# Patient Record
Sex: Female | Born: 1965
Health system: Southern US, Community
[De-identification: ages and names within clinical notes are randomized; demographics above are authoritative.]

## PROBLEM LIST (undated history)

## (undated) DIAGNOSIS — R51 Headache: Secondary | ICD-10-CM

## (undated) DIAGNOSIS — C73 Malignant neoplasm of thyroid gland: Secondary | ICD-10-CM

## (undated) DIAGNOSIS — R569 Unspecified convulsions: Secondary | ICD-10-CM

## (undated) DIAGNOSIS — E0590 Thyrotoxicosis, unspecified without thyrotoxic crisis or storm: Secondary | ICD-10-CM

## (undated) DIAGNOSIS — G709 Myoneural disorder, unspecified: Secondary | ICD-10-CM

## (undated) DIAGNOSIS — J45909 Unspecified asthma, uncomplicated: Secondary | ICD-10-CM

## (undated) DIAGNOSIS — M1990 Unspecified osteoarthritis, unspecified site: Secondary | ICD-10-CM

## (undated) DIAGNOSIS — G259 Extrapyramidal and movement disorder, unspecified: Secondary | ICD-10-CM

## (undated) HISTORY — DX: Unspecified asthma, uncomplicated: J45.909

## (undated) HISTORY — PX: WISDOM TOOTH EXTRACTION: SHX21

## (undated) HISTORY — PX: ABDOMINAL HYSTERECTOMY: SHX81

## (undated) HISTORY — DX: Unspecified osteoarthritis, unspecified site: M19.90

## (undated) HISTORY — PX: CATARACT EXTRACTION: SUR2

---

## 1999-12-14 ENCOUNTER — Other Ambulatory Visit: Admission: RE | Admit: 1999-12-14 | Discharge: 1999-12-14 | Payer: Self-pay | Admitting: Obstetrics and Gynecology

## 2000-11-29 ENCOUNTER — Other Ambulatory Visit: Admission: RE | Admit: 2000-11-29 | Discharge: 2000-11-29 | Payer: Self-pay | Admitting: Obstetrics and Gynecology

## 2001-07-05 ENCOUNTER — Ambulatory Visit (HOSPITAL_COMMUNITY): Admission: RE | Admit: 2001-07-05 | Discharge: 2001-07-05 | Payer: Self-pay | Admitting: General Practice

## 2001-07-05 ENCOUNTER — Encounter: Payer: Self-pay | Admitting: General Practice

## 2001-12-10 ENCOUNTER — Other Ambulatory Visit: Admission: RE | Admit: 2001-12-10 | Discharge: 2001-12-10 | Payer: Self-pay | Admitting: Obstetrics and Gynecology

## 2003-05-28 ENCOUNTER — Other Ambulatory Visit: Admission: RE | Admit: 2003-05-28 | Discharge: 2003-05-28 | Payer: Self-pay | Admitting: Obstetrics and Gynecology

## 2005-03-21 ENCOUNTER — Encounter: Admission: RE | Admit: 2005-03-21 | Discharge: 2005-03-21 | Payer: Self-pay | Admitting: Family Medicine

## 2005-04-10 ENCOUNTER — Encounter: Admission: RE | Admit: 2005-04-10 | Discharge: 2005-04-10 | Payer: Self-pay | Admitting: Family Medicine

## 2007-12-13 ENCOUNTER — Emergency Department (HOSPITAL_COMMUNITY): Admission: EM | Admit: 2007-12-13 | Discharge: 2007-12-13 | Payer: Self-pay | Admitting: Emergency Medicine

## 2011-11-15 ENCOUNTER — Encounter (HOSPITAL_COMMUNITY): Payer: Self-pay | Admitting: Pharmacist

## 2011-11-22 ENCOUNTER — Encounter (INDEPENDENT_AMBULATORY_CARE_PROVIDER_SITE_OTHER): Payer: BC Managed Care – PPO | Admitting: Obstetrics and Gynecology

## 2011-11-22 DIAGNOSIS — N920 Excessive and frequent menstruation with regular cycle: Secondary | ICD-10-CM

## 2011-11-22 DIAGNOSIS — N800 Endometriosis of uterus: Secondary | ICD-10-CM

## 2011-11-22 DIAGNOSIS — N949 Unspecified condition associated with female genital organs and menstrual cycle: Secondary | ICD-10-CM

## 2011-11-22 DIAGNOSIS — N946 Dysmenorrhea, unspecified: Secondary | ICD-10-CM

## 2011-11-23 NOTE — H&P (Signed)
NAMECLEDA, IMEL             ACCOUNT NO.:  [id number]  MEDICAL RECORD NO.:  [id number]  LOCATION:                                 FACILITY:  PHYSICIAN:  Crist Fat. Rivard, M.D. DATE OF BIRTH:  August 17, 1966  DATE OF ADMISSION:  12/05/2011 DATE OF DISCHARGE:                             HISTORY & PHYSICAL   HISTORY OF PRESENT ILLNESS:  Ms. Mance is a 46 year old married white female, para 1-0-0-1, presenting for a robot-assisted hysterectomy because of menorrhagia and severe dysmenorrhea.  For over 10 years, the patient has experienced significant menstrual cramping that was originally relieved by using the Ortho Evra patch, however, the patch caused her to have significant irregular bleeding.  The patient was then placed on oral contraceptives, however, that regimen caused her mouth to become numb.  Consequently, the patient managed her cramps with heating pads and other nonsteroidal pain medications.  Over the last year, however, in addition to increasing intensity in her menstrual cramping, the patient began to experience daily pelvic pressure and cramping that she describes as the contractions of child birth.  She occasionally will have a burning sensation, particularly when she is having her menstrual Periods. She also describes sharp pain that will radiate down her left leg to her foot and cause parts of her foot to become numb and her left vulvar area.  With the patient's monthly menstrual flow, that lasts for 8 days, she will change a pad every 2 hours and endure very large clots that on many occasions, will cause her to soil her clothes.  It is  further noted that during 2 days of her 8 day flow, her cramping becomes so severe that she will actually faint and vomit.  The patient has, on occasion, found some relief from her pain by taking ibuprofen 800 mg, however, over time it caused her to develop mouth sores.  The patient also, on occasion, will experience relief by taking  Vicodin, but nothing will totally eliminate her pain.  She admits to dyspareunia, occasional hot flashes, and constipation, but denies any intermenstrual bleeding, vaginitis symptoms, or urinary tract symptoms.    A pelvic ultrasound in November 2012, showed uterus measuring 9.63 x 7.40 x 6.71 cm, with normal-appearing ovaries bilaterally, though it was noted that the patient's uterus appeared bulky and was tender throughout the exam with other features that questioned the presence of adenomyosis.  The patient also had a TSH performed at the same time that was within normal limits. A review of both medical and surgical management options were given to the patient, however, due to the debilitating and protracted nature of her symptoms, she has decided to proceed with definitive therapy in the form of hysterectomy.  PAST MEDICAL HISTORY:  OB History:  Gravida 1, para 1-0-0-1, the patient had a cesarean section in 1991. GYN History:  Menarche at 46 years old.  Last menstrual period November 18, 2011.  She does not use any method of contraception.  She has no history of abnormal Pap smears or sexually transmitted diseases.  Her last normal Pap smear was November 2012. 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  SURGICAL HISTORY:  Negative.  She has no history of blood transfusions.  FAMILY HISTORY:  Breast cancer (maternal grandmother, age 17), migraines, and hypertension.  HABITS:  The patient does not use tobacco or illicit drugs.  She occasionally will consume alcohol.  SOCIAL HISTORY:  The patient is married and she is employed as an Print production planner in a Therapist, art.  CURRENT MEDICATIONS: 1. Trileptal 300 mg 2 tablets in the morning, 3 tablets at bedtime. 2. Tramadol 50 mg 2 tablets as needed for migraines. 3. Robaxin 750  mg daily as needed. 4. Lorazepam 1 mg daily as needed. 5. Vitex 1 tablet daily. 6. Vitamin E 1000 units daily. 7. Calcium with vitamin D 1 tablet daily. 8. Vitamin C 500 mg 2 tablets once daily. 9. Vitamin B 1000 mcg daily. 10.Swiss-Kriss twice daily.  ALLERGIES:  The patient has sensitivity to ORAL CONTRACEPTIVES, they cause her mouth to become numb.  IBUPROFEN causes her to develop mouth sores.  The patient also is sensitive to various COATINGS ON PILLS.  She denied any sensitivity to peanuts, soy, latex, or shellfish.  REVIEW OF SYSTEMS:  The patient wears glasses.  She has occasional finger arthralgias and ankle arthralgias.  She has problems sleeping, urinary frequency, abdominal bloating, back pain, left leg pain with paresthesias.  Denies, however, any chest pain, shortness of breath, only headache with vision changes when she has migraines.  Denies chronic congestion, difficulty swallowing, dysuria, hematuria, myalgias, and except when she has a flare of her psoriasis, she denies any skin rashes.  PHYSICAL EXAMINATION:  VITAL SIGNS:  Blood pressure is 110/60, pulse is 68, temperature 98.8 degrees Fahrenheit orally, weight 130 pounds, height 5 feet 3 inches tall, body mass index 23. NECK:  Supple without masses.  There is no thyromegaly or cervical adenopathy. HEART:  Regular rate and rhythm. LUNGS:  Clear. BACK:  No CVA tenderness. ABDOMEN:  Diffusely tender without any guarding, rebound, palpable masses, or organomegaly. EXTREMITIES:  No clubbing, cyanosis, or edema. PELVIC:  EG/BUS is normal.  Vagina is normal though there is a large amount of blood in the vaginal vault.  Also, the vaginal walls are tender.  Cervix is without any lesions.  It is very anterior and tender. Uterus appears upper limits of normal size, it is tender.  Adnexa is without any palpable masses, but it is tender bilaterally.  IMPRESSION: 1. Menorrhagia. 2. Severe dysmenorrhea 3. Probable  adenomyosis.  DISPOSITION:  A discussion was held with the patient regarding indications for her procedure along with its risks, which include, but are not limited to, reaction to anesthesia, damage to adjacent organs, infection, excessive bleeding, and the possible need for an open abdominal incision.  The patient was given a MiraLax bowel prep to be completed 24 hours prior to her procedure.  She was further advised that she will experience transient postoperative facial edema, that the robotic approach to her surgery requires more time than that of an open abdominal incision, that her hospital stay is expected to be 0-2 days, and she is expected to be able to return to her usual activities in 2-3 weeks (with the exception of intercourse which will be 6 weeks).  The patient verbalized understanding of these risks as well as instructions for preoperative preparation and has consented to proceed with a robot- assisted hysterectomy at [facility] on December 05, 2011 at 12:15 p.m.     Rashmi Tallent J. Lowell Guitar, P.A.-C  ______________________________ Crist Fat Rivard, M.D.    EJP/MEDQ  D:  11/22/2011  T:  11/23/2011  Job:  [id number]

## 2011-11-27 ENCOUNTER — Encounter (HOSPITAL_COMMUNITY)
Admission: RE | Admit: 2011-11-27 | Discharge: 2011-11-27 | Disposition: A | Discharge status: Home / Self Care | Payer: BC Managed Care – PPO | Source: Ambulatory Visit | Attending: Obstetrics and Gynecology | Admitting: Obstetrics and Gynecology

## 2011-11-27 ENCOUNTER — Encounter (HOSPITAL_COMMUNITY): Payer: Self-pay

## 2011-11-27 HISTORY — DX: Extrapyramidal and movement disorder, unspecified: G25.9

## 2011-11-27 HISTORY — DX: Headache: R51

## 2011-11-27 HISTORY — DX: Unspecified convulsions: R56.9

## 2011-11-27 HISTORY — DX: Myoneural disorder, unspecified: G70.9

## 2011-11-27 LAB — CBC
HCT: 35.1 % — ABNORMAL LOW (ref 36.0–46.0)
MCV: 93.9 fL (ref 78.0–100.0)
RDW: 11.7 % (ref 11.5–15.5)
WBC: 4.1 10*3/uL (ref 4.0–10.5)

## 2011-11-27 NOTE — Anesthesia Preprocedure Evaluation (Addendum)
Anesthesia Evaluation  Patient identified by MRN, date of birth, ID band Patient awake    Reviewed: Allergy & Precautions, H&P , NPO status , Patient's Chart, lab work & pertinent test results, reviewed documented beta blocker date and time   History of Anesthesia Complications Negative for: history of anesthetic complications  Airway Mallampati: II TM Distance: >3 FB Neck ROM: full    Dental No notable dental hx. (+) Teeth Intact and Dental Advisory Given   Pulmonary neg pulmonary ROS, former smoker breath sounds clear to auscultation  Pulmonary exam normal       Cardiovascular Exercise Tolerance: Good negative cardio ROS  Rhythm:regular Rate:Normal     Neuro/Psych  Headaches, Well Controlled,   Neuromuscular disease (dyskinesia R side, jaw periodically) negative neurological ROS  negative psych ROS   GI/Hepatic negative GI ROS, Neg liver ROS,   Endo/Other  negative endocrine ROS  Renal/GU negative Renal ROS     Musculoskeletal   Abdominal   Peds  Hematology negative hematology ROS (+)   Anesthesia Other Findings  Headache   migraines Seizures   as child    Movement disorder   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-takes trileptal to control Neuromuscular disorder   movement disorder    Reproductive/Obstetrics negative OB ROS                          Anesthesia Physical Anesthesia Plan  ASA: II  Anesthesia Plan: General ETT   Post-op Pain Management:    Induction: Intravenous  Airway Management Planned: Oral ETT  Additional Equipment:   Intra-op Plan:   Post-operative Plan: Extubation in OR  Informed Consent: I have reviewed the patients History and Physical, chart, labs and discussed the procedure including the risks, benefits and alternatives for the proposed anesthesia with the patient or authorized representative who has indicated  his/her understanding and acceptance.   Dental Advisory Given  Plan Discussed with: CRNA and Surgeon  Anesthesia Plan Comments: (Discussed risk of increased chorioathetoid movements post operatively.  She is going to continue to take her trileptal.  Benzodiazepines are known to help with acute exacerbations. Patient would like to have antiemetic medications even if they increase the risk worsening her PKD.  Patients questions were answered.  NB: drugs used as an alternative treatment for PKD are risperidone (Karakurum et al. 2003), acetazolamide, levodopa, flunarizine, and tetrabenazine (Jankovic and Demirkiran, 2002).  Plan routine monitors, GETA, careful positioning )      Anesthesia Quick Evaluation

## 2011-11-27 NOTE — Pre-Procedure Instructions (Signed)
Dr Sheral Apley spoke with pt concerning PKC

## 2011-11-27 NOTE — Patient Instructions (Addendum)
   Your procedure is scheduled WU:[id number] March 12th Enter through the Main Entrance of [facility] at:10:45am Pick up the phone at the desk and dial [phone] and inform us of your arrival.  Please call this number if you have any problems the morning of surgery: [phone]  Remember: Do not eat food after midnight: Monday Do not drink clear liquids after:midnight Monday Take these medicines the morning of surgery with a SIP OF WATER: morning medications  Do not wear jewelry, make-up, or FINGER nail polish Do not wear lotions, powders, perfumes or deodorant. Do not shave 48 hours prior to surgery. Do not bring valuables to the hospital. Contacts, dentures or bridgework may not be worn into surgery.  Leave suitcase in the car. After Surgery it may be brought to your room. For patients being admitted to the hospital, checkout time is 11:00am the day of discharge.  Patients discharged on the day of surgery will not be allowed to drive home.     Remember to use your hibiclens as instructed.Please shower with 1/2 bottle the evening before your surgery and the other 1/2 bottle the morning of surgery. Neck down avoiding private area.

## 2011-12-05 ENCOUNTER — Encounter (HOSPITAL_COMMUNITY): Payer: Self-pay | Admitting: *Deleted

## 2011-12-05 ENCOUNTER — Ambulatory Visit (HOSPITAL_COMMUNITY): Payer: BC Managed Care – PPO | Admitting: Anesthesiology

## 2011-12-05 ENCOUNTER — Encounter (HOSPITAL_COMMUNITY): Payer: Self-pay | Admitting: Anesthesiology

## 2011-12-05 ENCOUNTER — Encounter (HOSPITAL_COMMUNITY)
Admission: RE | Disposition: A | Discharge status: Home / Self Care | Payer: Self-pay | Source: Ambulatory Visit | Attending: Obstetrics and Gynecology

## 2011-12-05 ENCOUNTER — Ambulatory Visit (HOSPITAL_COMMUNITY)
Admission: RE | Admit: 2011-12-05 | Discharge: 2011-12-05 | Disposition: A | Discharge status: Home / Self Care | Payer: BC Managed Care – PPO | Source: Ambulatory Visit | Attending: Obstetrics and Gynecology | Admitting: Obstetrics and Gynecology

## 2011-12-05 DIAGNOSIS — N946 Dysmenorrhea, unspecified: Secondary | ICD-10-CM

## 2011-12-05 DIAGNOSIS — N920 Excessive and frequent menstruation with regular cycle: Secondary | ICD-10-CM

## 2011-12-05 DIAGNOSIS — Z01812 Encounter for preprocedural laboratory examination: Secondary | ICD-10-CM | POA: Insufficient documentation

## 2011-12-05 HISTORY — PX: ROBOTIC ASSISTED LAP VAGINAL HYSTERECTOMY: SHX2362

## 2011-12-05 SURGERY — ROBOTIC ASSISTED TOTAL HYSTERECTOMY
Anesthesia: General | Site: Abdomen | Wound class: Clean Contaminated

## 2011-12-05 MED ORDER — PROPOFOL 10 MG/ML IV EMUL
INTRAVENOUS | Status: DC | PRN
  Administered 2011-12-05: 20 mg via INTRAVENOUS
  Administered 2011-12-05: 50 mg via INTRAVENOUS
  Administered 2011-12-05: 180 mg via INTRAVENOUS
  Administered 2011-12-05: 50 mg via INTRAVENOUS

## 2011-12-05 MED ORDER — ONDANSETRON HCL 4 MG/2ML IJ SOLN
INTRAMUSCULAR | Status: AC
  Filled 2011-12-05: qty 2

## 2011-12-05 MED ORDER — MIDAZOLAM HCL 2 MG/2ML IJ SOLN
INTRAMUSCULAR | Status: AC
  Filled 2011-12-05: qty 2

## 2011-12-05 MED ORDER — ONDANSETRON HCL 4 MG/2ML IJ SOLN: 4.0000 mg | Freq: Four times a day (QID) | INTRAMUSCULAR | Status: DC | PRN

## 2011-12-05 MED ORDER — OXYCODONE-ACETAMINOPHEN 5-325 MG PO TABS: 1.0000 | ORAL_TABLET | Freq: Four times a day (QID) | ORAL | Status: AC | PRN

## 2011-12-05 MED ORDER — OXYCODONE-ACETAMINOPHEN 5-325 MG PO TABS
1.0000 | ORAL_TABLET | ORAL | Status: DC | PRN
  Administered 2011-12-05: 2 via ORAL
  Filled 2011-12-05: qty 2

## 2011-12-05 MED ORDER — GLYCOPYRROLATE 0.2 MG/ML IJ SOLN
INTRAMUSCULAR | Status: DC | PRN
  Administered 2011-12-05: 0.4 mg via INTRAVENOUS

## 2011-12-05 MED ORDER — IBUPROFEN 600 MG PO TABS: 600.0000 mg | ORAL_TABLET | Freq: Four times a day (QID) | ORAL | Status: AC

## 2011-12-05 MED ORDER — HYDROMORPHONE HCL PF 1 MG/ML IJ SOLN
INTRAMUSCULAR | Status: AC
  Filled 2011-12-05: qty 1

## 2011-12-05 MED ORDER — DEXTROSE 5 % IV SOLN: 1.0000 g | Freq: Two times a day (BID) | INTRAVENOUS | Status: DC

## 2011-12-05 MED ORDER — BUPIVACAINE HCL (PF) 0.25 % IJ SOLN
INTRAMUSCULAR | Status: AC
  Filled 2011-12-05: qty 30

## 2011-12-05 MED ORDER — ONDANSETRON HCL 4 MG PO TABS: 4.0000 mg | ORAL_TABLET | Freq: Three times a day (TID) | ORAL | Status: AC | PRN

## 2011-12-05 MED ORDER — NEOSTIGMINE METHYLSULFATE 1 MG/ML IJ SOLN
INTRAMUSCULAR | Status: DC | PRN
  Administered 2011-12-05: 3 mg via INTRAVENOUS

## 2011-12-05 MED ORDER — STERILE WATER FOR IRRIGATION IR SOLN
Status: DC | PRN
  Administered 2011-12-05: 1000 mL via INTRAVESICAL

## 2011-12-05 MED ORDER — PROPOFOL 10 MG/ML IV EMUL
INTRAVENOUS | Status: AC
  Filled 2011-12-05: qty 20

## 2011-12-05 MED ORDER — KETOROLAC TROMETHAMINE 30 MG/ML IJ SOLN
INTRAMUSCULAR | Status: DC | PRN
  Administered 2011-12-05: 30 mg via INTRAVENOUS

## 2011-12-05 MED ORDER — HYDROMORPHONE HCL PF 1 MG/ML IJ SOLN
INTRAMUSCULAR | Status: DC | PRN
  Administered 2011-12-05: 1 mg via INTRAVENOUS

## 2011-12-05 MED ORDER — IBUPROFEN 600 MG PO TABS: 600.0000 mg | ORAL_TABLET | Freq: Four times a day (QID) | ORAL | Status: DC

## 2011-12-05 MED ORDER — DEXTROSE 5 % IV SOLN
1.0000 g | Freq: Two times a day (BID) | INTRAVENOUS | Status: DC
  Administered 2011-12-05: 1 g via INTRAVENOUS
  Filled 2011-12-05 (×4): qty 1

## 2011-12-05 MED ORDER — IBUPROFEN 600 MG PO TABS: 600.0000 mg | ORAL_TABLET | Freq: Four times a day (QID) | ORAL | Status: AC | PRN

## 2011-12-05 MED ORDER — MIDAZOLAM HCL 5 MG/5ML IJ SOLN
INTRAMUSCULAR | Status: DC | PRN
  Administered 2011-12-05: 2 mg via INTRAVENOUS

## 2011-12-05 MED ORDER — DEXAMETHASONE SODIUM PHOSPHATE 4 MG/ML IJ SOLN
INTRAMUSCULAR | Status: DC | PRN
  Administered 2011-12-05: 10 mg via INTRAVENOUS

## 2011-12-05 MED ORDER — GLYCOPYRROLATE 0.2 MG/ML IJ SOLN
INTRAMUSCULAR | Status: AC
  Filled 2011-12-05: qty 1

## 2011-12-05 MED ORDER — ROCURONIUM BROMIDE 50 MG/5ML IV SOLN
INTRAVENOUS | Status: AC
  Filled 2011-12-05: qty 1

## 2011-12-05 MED ORDER — LIDOCAINE HCL (CARDIAC) 20 MG/ML IV SOLN
INTRAVENOUS | Status: AC
  Filled 2011-12-05: qty 5

## 2011-12-05 MED ORDER — ONDANSETRON HCL 4 MG/2ML IJ SOLN
INTRAMUSCULAR | Status: DC | PRN
  Administered 2011-12-05: 4 mg via INTRAVENOUS

## 2011-12-05 MED ORDER — LACTATED RINGERS IV SOLN: INTRAVENOUS | Status: DC

## 2011-12-05 MED ORDER — HYDROMORPHONE HCL PF 1 MG/ML IJ SOLN
INTRAMUSCULAR | Status: AC
  Administered 2011-12-05: 0.5 mg via INTRAVENOUS
  Filled 2011-12-05: qty 1

## 2011-12-05 MED ORDER — FENTANYL CITRATE 0.05 MG/ML IJ SOLN
INTRAMUSCULAR | Status: DC | PRN
  Administered 2011-12-05 (×3): 50 ug via INTRAVENOUS
  Administered 2011-12-05: 100 ug via INTRAVENOUS

## 2011-12-05 MED ORDER — BUPIVACAINE HCL (PF) 0.25 % IJ SOLN
INTRAMUSCULAR | Status: DC | PRN
  Administered 2011-12-05: 12 mL

## 2011-12-05 MED ORDER — DEXAMETHASONE SODIUM PHOSPHATE 10 MG/ML IJ SOLN
INTRAMUSCULAR | Status: AC
  Filled 2011-12-05: qty 1

## 2011-12-05 MED ORDER — LACTATED RINGERS IR SOLN
Status: DC | PRN
  Administered 2011-12-05: 3000 mL

## 2011-12-05 MED ORDER — FENTANYL CITRATE 0.05 MG/ML IJ SOLN
INTRAMUSCULAR | Status: AC
  Filled 2011-12-05: qty 5

## 2011-12-05 MED ORDER — HYDROMORPHONE HCL PF 1 MG/ML IJ SOLN
0.2500 mg | INTRAMUSCULAR | Status: DC | PRN
  Administered 2011-12-05 (×2): 0.5 mg via INTRAVENOUS

## 2011-12-05 MED ORDER — OXYCODONE-ACETAMINOPHEN 5-325 MG PO TABS: 1.0000 | ORAL_TABLET | ORAL | Status: AC | PRN

## 2011-12-05 MED ORDER — ROCURONIUM BROMIDE 100 MG/10ML IV SOLN
INTRAVENOUS | Status: DC | PRN
  Administered 2011-12-05: 30 mg via INTRAVENOUS
  Administered 2011-12-05: 40 mg via INTRAVENOUS
  Administered 2011-12-05: 20 mg via INTRAVENOUS
  Administered 2011-12-05: 10 mg via INTRAVENOUS

## 2011-12-05 MED ORDER — LIDOCAINE HCL (CARDIAC) 20 MG/ML IV SOLN
INTRAVENOUS | Status: DC | PRN
  Administered 2011-12-05 (×2): 30 mg via INTRAVENOUS

## 2011-12-05 MED ORDER — LACTATED RINGERS IV SOLN
INTRAVENOUS | Status: DC | PRN
  Administered 2011-12-05 (×3): via INTRAVENOUS

## 2011-12-05 SURGICAL SUPPLY — 71 items
ADH SKN CLS APL DERMABOND .7 (GAUZE/BANDAGES/DRESSINGS) ×1
APL SKNCLS STERI-STRIP NONHPOA (GAUZE/BANDAGES/DRESSINGS)
BAG URINE DRAINAGE (UROLOGICAL SUPPLIES) ×4 IMPLANT
BARRIER ADHS 3X4 INTERCEED (GAUZE/BANDAGES/DRESSINGS) ×2 IMPLANT
BENZOIN TINCTURE PRP APPL 2/3 (GAUZE/BANDAGES/DRESSINGS) ×1 IMPLANT
BRR ADH 4X3 ABS CNTRL BYND (GAUZE/BANDAGES/DRESSINGS) ×1
CABLE HIGH FREQUENCY MONO STRZ (ELECTRODE) ×2 IMPLANT
CATH FOLEY 3WAY  5CC 16FR (CATHETERS) ×2
CATH FOLEY 3WAY  5CC 18FR (CATHETERS)
CATH FOLEY 3WAY 5CC 16FR (CATHETERS) ×2 IMPLANT
CATH FOLEY 3WAY 5CC 18FR (CATHETERS) ×1 IMPLANT
CHLORAPREP W/TINT 26ML (MISCELLANEOUS) ×2 IMPLANT
CLOTH BEACON ORANGE TIMEOUT ST (SAFETY) ×2 IMPLANT
CONT PATH 16OZ SNAP LID 3702 (MISCELLANEOUS) ×2 IMPLANT
CORDS BIPOLAR (ELECTRODE) IMPLANT
COVER MAYO STAND STRL (DRAPES) ×2 IMPLANT
COVER TABLE BACK 60X90 (DRAPES) ×4 IMPLANT
COVER TIP SHEARS 8 DVNC (MISCELLANEOUS) ×1 IMPLANT
COVER TIP SHEARS 8MM DA VINCI (MISCELLANEOUS) ×1
DECANTER SPIKE VIAL GLASS SM (MISCELLANEOUS) ×2 IMPLANT
DERMABOND ADVANCED (GAUZE/BANDAGES/DRESSINGS) ×1
DERMABOND ADVANCED .7 DNX12 (GAUZE/BANDAGES/DRESSINGS) ×1 IMPLANT
DRAPE HUG U DISPOSABLE (DRAPE) ×2 IMPLANT
DRAPE LG THREE QUARTER DISP (DRAPES) ×4 IMPLANT
DRAPE MONITOR DA VINCI (DRAPE) IMPLANT
DRAPE WARM FLUID 44X44 (DRAPE) ×2 IMPLANT
ELECT REM PT RETURN 9FT ADLT (ELECTROSURGICAL) ×2
ELECTRODE REM PT RTRN 9FT ADLT (ELECTROSURGICAL) ×1 IMPLANT
EVACUATOR SMOKE 8.L (FILTER) ×2 IMPLANT
GAUZE VASELINE 3X9 (GAUZE/BANDAGES/DRESSINGS) IMPLANT
GLOVE BIOGEL PI IND STRL 7.0 (GLOVE) ×3 IMPLANT
GLOVE BIOGEL PI INDICATOR 7.0 (GLOVE) ×4
GLOVE ECLIPSE 6.5 STRL STRAW (GLOVE) ×7 IMPLANT
GOWN STRL REIN XL XLG (GOWN DISPOSABLE) ×14 IMPLANT
GRASPER BIPOLAR FEN DA VINCI (INSTRUMENTS)
GRASPER BPLR FEN DVNC (INSTRUMENTS) IMPLANT
KIT ACCESSORY DA VINCI DISP (KITS) ×1
KIT ACCESSORY DVNC DISP (KITS) ×1 IMPLANT
KIT DISP ACCESSORY 4 ARM (KITS) IMPLANT
NS IRRIG 1000ML POUR BTL (IV SOLUTION) ×6 IMPLANT
OCCLUDER COLPOPNEUMO (BALLOONS) ×1 IMPLANT
PACK LAVH (CUSTOM PROCEDURE TRAY) ×2 IMPLANT
PAD PREP 24X48 CUFFED NSTRL (MISCELLANEOUS) ×4 IMPLANT
PLUG CATH AND CAP STER (CATHETERS) ×2 IMPLANT
POSITIONER SURGICAL ARM (MISCELLANEOUS) ×4 IMPLANT
PROTECTOR NERVE ULNAR (MISCELLANEOUS) ×4 IMPLANT
SET CYSTO W/LG BORE CLAMP LF (SET/KITS/TRAYS/PACK) ×1 IMPLANT
SET IRRIG TUBING LAPAROSCOPIC (IRRIGATION / IRRIGATOR) ×2 IMPLANT
SOLUTION ELECTROLUBE (MISCELLANEOUS) ×2 IMPLANT
SPONGE LAP 18X18 X RAY DECT (DISPOSABLE) IMPLANT
STRIP CLOSURE SKIN 1/2X4 (GAUZE/BANDAGES/DRESSINGS) ×2 IMPLANT
SUT MNCRL AB 3-0 PS2 27 (SUTURE) IMPLANT
SUT VIC AB 0 CT1 27 (SUTURE) ×12
SUT VIC AB 0 CT1 27XBRD ANTBC (SUTURE) ×6 IMPLANT
SUT VIC AB 0 CT2 27 (SUTURE) IMPLANT
SUT VIC AB 2-0 CT2 27 (SUTURE) ×2 IMPLANT
SUT VICRYL 0 UR6 27IN ABS (SUTURE) ×4 IMPLANT
SYR 50ML LL SCALE MARK (SYRINGE) ×2 IMPLANT
SYSTEM CONVERTIBLE TROCAR (TROCAR) ×2 IMPLANT
TIP UTERINE 5.1X6CM LAV DISP (MISCELLANEOUS) IMPLANT
TIP UTERINE 6.7X10CM GRN DISP (MISCELLANEOUS) ×1 IMPLANT
TIP UTERINE 6.7X6CM WHT DISP (MISCELLANEOUS) IMPLANT
TIP UTERINE 6.7X8CM BLUE DISP (MISCELLANEOUS) IMPLANT
TOWEL OR 17X24 6PK STRL BLUE (TOWEL DISPOSABLE) ×6 IMPLANT
TROCAR 12M 150ML BLUNT (TROCAR) ×2 IMPLANT
TROCAR DISP BLADELESS 8 DVNC (TROCAR) ×1 IMPLANT
TROCAR DISP BLADELESS 8MM (TROCAR) ×1
TROCAR XCEL 12X100 BLDLESS (ENDOMECHANICALS) ×2 IMPLANT
TROCAR Z-THREAD BLADED 12X100M (TROCAR) IMPLANT
TUBING FILTER THERMOFLATOR (ELECTROSURGICAL) ×2 IMPLANT
WARMER LAPAROSCOPE (MISCELLANEOUS) ×2 IMPLANT

## 2011-12-05 NOTE — Anesthesia Postprocedure Evaluation (Signed)
Anesthesia Post Note  Patient: Rhonda Deleon  Procedure(s) Performed: Procedure(s) (LRB): ROBOTIC ASSISTED TOTAL HYSTERECTOMY (N/A) ROBOTIC ASSISTED LAPAROSCOPIC LYSIS OF ADHESION (N/A)  Anesthesia type: General  Patient location: PACU  Post pain: Pain level controlled  Post assessment: Post-op Vital signs reviewed  Last Vitals:  Filed Vitals:   12/05/11 1515  BP: 123/64  Pulse: 93  Temp: 36.9 C  Resp: 14    Post vital signs: Reviewed  Level of consciousness: sedated  Complications: No apparent anesthesia complicationsfj

## 2011-12-05 NOTE — Discharge Planning (Cosign Needed)
Physician Discharge Summary  Patient ID: Rhonda Deleon MRN: [id number] DOB/AGE: July 21, 1966 46 y.o.  Admit date: 12/05/2011 Discharge date: 12/05/2011  Admission Diagnoses: Menorrhagia, Dysmenorrhea  Discharge Diagnoses: Menorrhagia, Dysmenorrhea        Principal Problem:  *Menorrhagia   Discharged Condition: Good  Hospital Course: On date of admission patient underwent a robot assisted laparoscopic hysterectomy with lysis of adhesions and tolerated procedure well.   Disposition: Discharged Home  Discharge Orders    Future Appointments: Provider: Department: Dept Phone: Center:   12/19/2011 9:40 AM Esmeralda Arthur, MD [facility] [phone] None   01/17/2012 9:50 AM Esmeralda Arthur, MD [facility] [phone] None     Future Orders Please Complete By Expires   Nursing communication      Scheduling Instructions:   Please discontinue IV before discharge     Medication List  As of 12/05/2011  3:19 PM   TAKE these medications         ascorbic acid 500 MG tablet   Commonly known as: VITAMIN C   Take 1,000 mg by mouth daily.      CALCIUM-VITAMIN D PO   Take 1 tablet by mouth daily. Calcium 1200 mg & Vitamin D 1000 IU      cholecalciferol 1000 UNITS tablet   Commonly known as: VITAMIN D   Take 1,000 Units by mouth daily.      FERROGELS FORTE 460-60-0.01-1 MG Caps   Generic drug: Fe Fum-Vit C-Vit B12-FA   Take 1 capsule by mouth 2 (two) times daily.      ibuprofen 600 MG tablet   Commonly known as: ADVIL,MOTRIN   Take 1 tablet (600 mg total) by mouth every 6 (six) hours as needed for pain.      ondansetron 4 MG tablet   Commonly known as: ZOFRAN   Take 1 tablet (4 mg total) by mouth every 8 (eight) hours as needed for nausea.      OVER THE COUNTER MEDICATION   Take 2 tablets by mouth daily. Swiss Kriss Laxative      Oxcarbazepine 300 MG tablet   Commonly known as: TRILEPTAL   Take 600-900 mg by mouth 2 (two) times daily. 600 mg in the morning and 900 mg in the  evening      oxyCODONE-acetaminophen 5-325 MG per tablet   Commonly known as: PERCOCET   Take 1 tablet by mouth every 6 (six) hours as needed for pain.      SUMAtriptan 6 MG/0.5ML Soln injection   Commonly known as: IMITREX   Inject 6 mg into the skin every 2 (two) hours as needed. For migraines      traMADol 50 MG tablet   Commonly known as: ULTRAM   Take 100 mg by mouth daily as needed. For migraines      vitamin B-12 1000 MCG tablet   Commonly known as: CYANOCOBALAMIN   Take 1,000 mcg by mouth daily.      vitamin E 1000 UNIT capsule   Take 1,000 Units by mouth daily.      VITEX EXTRACT PO   Take 1 tablet by mouth daily.           Follow-up Information    Follow up with [facility], MD on 01/17/2012. (Appointment is at 9:50 a.m.)    Contact information:   [address] [place] [address] [phone]          Signed: Patrick Jupiter 12/05/2011, 3:19 PM

## 2011-12-05 NOTE — Brief Op Note (Addendum)
12/05/2011  3:00 PM  PATIENT:  Rhonda Deleon  46 y.o. female  PRE-OPERATIVE DIAGNOSIS:  Menorrhagia, Dysmenorrhea  POST-OPERATIVE DIAGNOSIS:  Menorrhagia, Dysmenorrhea    Procedure(s): ROBOTIC ASSISTED TOTAL HYSTERECTOMY ROBOTIC ASSISTED LAPAROSCOPIC LYSIS OF ADHESION    Surgeon(s): Esmeralda Arthur, MD   ASSISTANTS: Henreitta Leber PA-C   ANESTHESIA:   general  LOCAL MEDICATIONS USED:  MARCAINE     SPECIMEN:  uteru weighing 250.9 g  DISPOSITION OF SPECIMEN:  PATHOLOGY  COUNTS:  YES  ESTIMATED BLOOD LOSS: 30 cc  PATIENT DISPOSITION:  PACU - hemodynamically stable.   Dictation #[id number]  [id number] AMD 12/05/2011 3:00 PM

## 2011-12-05 NOTE — Transfer of Care (Signed)
Immediate Anesthesia Transfer of Care Note  Patient: Rhonda Deleon  Procedure(s) Performed: Procedure(s) (LRB): ROBOTIC ASSISTED TOTAL HYSTERECTOMY (N/A) ROBOTIC ASSISTED LAPAROSCOPIC LYSIS OF ADHESION (N/A)  Patient Location: PACU  Anesthesia Type: General  Level of Consciousness: awake, alert  and oriented  Airway & Oxygen Therapy: Patient Spontanous Breathing and Patient connected to nasal cannula oxygen  Post-op Assessment: Report given to PACU RN and Post -op Vital signs reviewed and stable  Post vital signs: stable  Complications: No apparent anesthesia complications

## 2011-12-05 NOTE — Progress Notes (Signed)
DC IV, Dc instructions reviewed, prescriptions given to patient and significant other. Lap sites WNL, pt has no complaints and no concerns. Tolerated diet, Ambulating and urinated sufficiently. DC to main entrance.

## 2011-12-05 NOTE — Interval H&P Note (Signed)
History and Physical Interval Note:  12/05/2011 7:32 AM  Rhonda Deleon  has presented today for surgery, with the diagnosis of Menorrhagia, Dysmenorrhea  The various methods of treatment have been discussed with the patient and family. After consideration of risks, benefits and other options for treatment, the patient has consented to  Procedure(s) (LRB): ROBOTIC ASSISTED TOTAL HYSTERECTOMY (N/A) as a surgical intervention .  The patients' history has been reviewed, patient examined, no change in status, stable for surgery.  I have reviewed the patients' chart and labs.  Questions were answered to the patient's satisfaction.     Karne Ozga A

## 2011-12-05 NOTE — Discharge Instructions (Signed)
Call [facility] @ [phone] if:  You have a temperature greater than or equal to 100.4 degrees Farenheit orally You have pain that is not made better by the pain medication given and taken as directed You have excessive bleeding or problems urinating You have any other questions or concerns  Take Colace (Docusate Sodium/Stool Softener) 100 mg 2-3 times daily while taking narcotic  pain medicine to avoid constipation or until bowel movements are regular.  You may walk up steps You may shower tomorrow You may resume a regular diet Keep incisions clean and dry Avoid anything in vagina for 6 weeks (or until after your post-operative visit)   Follow up with Dr. Estanislado Pandy on January 17 2012 @ 9:50 am

## 2011-12-06 NOTE — Op Note (Signed)
Rhonda Deleon, Rhonda Deleon             ACCOUNT NO.:  [id number]  MEDICAL RECORD NO.:  [id number]  LOCATION:  9309                          FACILITY:  [facility]  PHYSICIAN:  Crist Fat. Collette Pescador, M.D. DATE OF BIRTH:  1965-12-18  DATE OF PROCEDURE:  12/05/2011 DATE OF DISCHARGE:  12/05/2011                              OPERATIVE REPORT   PREOPERATIVE DIAGNOSIS:  Severe dysmenorrhea with menorrhagia.  POSTOPERATIVE DIAGNOSIS:  Severe dysmenorrhea with menorrhagia with pelvic adhesions.  ANESTHESIA:  General with Dr. Jean Rosenthal.  PROCEDURE:  Robotically assisted total hysterectomy with lysis of adhesions.  SURGEON:  Crist Fat. Mustafa Potts, M.D.  ASSISTANTMarquis Lunch. Lowell Guitar, P.A.  ESTIMATED BLOOD LOSS:  30 mL.  PROCEDURE IN DETAIL:  After being informed of the planned procedure with possible complications including bleeding, infection, injury to other organs, informed consent was obtained.  The patient was taken to OR #7 and given general anesthesia with endotracheal intubation without any complication.  She was placed in a lithotomy position on a sticky mattress and beanbag.  Arms were padded and tucked on each side, with knee-high sequential compressive devices.  She was then prepped and draped in a sterile fashion.  A Foley catheter was inserted into her bladder.  Pelvic exam reveals a retroverted uterus, slightly bulky but mobile and 2 normal adnexa.  A weighted speculum was inserted in the vagina and anterior lip of the cervix was grasped with a tenaculum forceps.  The uterus was sounded at 9.5 cm.  The cervix was easily dilated using Hegar dilator until #27, which allows easy placement of a #8 RUMI intrauterine manipulator.  Its balloon was inflated with 8 mL of saline.  This was positioned with a 3.5, KOH ring.  The KOH ring is sutured to the cervix with 2 simple sutures of 0-Vicryl, and retractors were removed as well as the tenaculum.  We infiltrated just above the umbilicus with 5 mL of  Marcaine 0.25 and performed a vertical 10 mm incision, which was brought down bluntly to the fascia.  The fascia was identified and grasped with 2 Kocher forceps and incised with Mayo scissors.  Peritoneum was entered bluntly.  A pursestring suture of 0-Vicryl was placed on the fascia and a 12-mm Hasson trocar was easily positioned in the abdominal cavity, held in place by the pursestring suture.  This allowed for easy insufflation of a pneumoperitoneum using warmed CO2 at a maximum pressure of 15 mmHg.  The camera was inserted for first evaluation of the pelvis, which revealed a normal anterior cul-de-sac, normal posterior cul-de-sac, a retroverted uterus that appears bulky and soft, which could be compatible with our suspicion of an adenomyosis.  Both tubes and both ovaries are normal, although the left ovary has a small corpus luteum cyst measuring approximately 1.5 cm.  We see adhesions between the cecum and the right pelvic wall, as well as the appendix and the infundibulopelvic ligaments.  Trocar placement was then decided and we proceeded with insertion of one 8-mm robotic trocar on the left, one 8-mm robotic trocar on the right, and one 10-mm patient side assistant trocar on the right, all under direct visualization after infiltration with Marcaine 0.25.  The robot was  docked on the left and a monopolar scissor was inserted in arm #1 and a PK Gyrus forceps was inserted in arm #2.  Preparation and docking is 32 minutes.  We started with the right side with cauterizing the right tube and sectioning, cauterizing the utero-ovarian ligament and sectioning it, and cauterizing the round ligament and sectioning it.  This gives [place] easy access to the front anterior sheath of the broad ligament, which is dissected and incised all the way to the left side overlooking the KOH ring, which was easily identified.  We then proceeded with blunt and sharp dissection of the bladder flap at the  vesicouterine junction, which was performed very easily despite the previous C-section.  The KOH ring was then easily visualized.  We then dissected down the posterior sheath of the broad ligament all the way to the posterior KOH ring keeping the right ureter under direct visualization.  We then proceeded in the same fashion on the left side.  First we cauterized the tube and sectioned it, then we cauterized the utero-ovarian ligament and sectioned, and then we cauterized the left round ligament and sectioned it.  Anterior sheath of the broad ligament was then dissected and incised to meet the first right-sided incision.  The rest of the bladder was dissected easily bluntly and sharply.  The posterior sheath of the broad ligament was then dissected down to the KOH ring skeletonizing the uterine artery bundle in the same fashion.  The bladder was then inflated with 200 mL of saline to note a satisfactory dissection of the bladder flap with no injury to the bladder.  It was then emptied and pressure was applied on the KOH ring to isolate the uterine artery on the left, which was then cauterized with PK forceps and sectioned, and we proceeded in the same way on the right with pressure on the KOH ring.  Ureter under direct visualization, we cauterized the uterine vessels at the level of the KOH ring using PK Gyrus forceps.  The vaginal occluder was then inflated and we proceed with a 360-degree colpotomy using an open monopolar scissor, freeing the uterus entirely.  The uterus was then delivered vaginally with some resistance.  The vaginal occluder was reinserted in the vagina and we cannot proceed with closure of the vaginal cuff after changing our instruments for suture cut in arm #1 and a long tipped forceps in arm #2.  The vaginal cuff was closed with figure-of-eight stitches of 0- Vicryl.  Hemostasis was completed with cauterization on peritoneal edges.  We then proceeded with profuse  irrigation with warm saline to confirm a satisfactory hemostasis on the vaginal cuff.  Some bleeding from the left ovary due to rupture of the corpus luteum cyst was then controlled with cauterization.  Both ureters were visualized to have good peristalsis and no dilatation.  A sheet of Interceed was then deposited on the vaginal cuff and underneath the ovaries.  Please note that while we proceeded on the right side, we also proceeded with sharp dissection of the previously mentioned adhesions freeing the cecum and freeing the appendix.  All instruments were then removed.  The robot was undocked.  Trocars were removed under direct visualization after evacuating the pneumoperitoneum.  The fascia of the supraumbilical incision was closed by the previously placed pursestring suture of 0-Vicryl.  The fascia of the patient side 10-mm trocar was closed with a figure-of-eight stitch of 0-Vicryl.  The skin of all incisions was then closed with subcuticular suture  of 3-0 Monocryl and Dermabond.  Vaginal revision confirms an adequate closure of the vaginal cuff with good hemostasis.  We do note 2 lateral vaginal laceration that are superficial but oozing.  Both were repaired with a running lock suture of 3-0 Vicryl.  We also noted a small vaginal fornix laceration that was also superficial and also closed with a running locked suture of 3-0 Vicryl.  Instrument and sponge count was complete x2.  Estimated blood loss was 30 mL.  The procedure was well tolerated by the patient who was taken to the recovery room in a well and stable condition.  SPECIMEN:  Uterus sent to pathology, weighing 250.9 g.     Dois Davenport A. Madisyn Mawhinney, M.D.     SAR/MEDQ  D:  12/05/2011  T:  12/06/2011  Job:  [id number]

## 2011-12-06 NOTE — Addendum Note (Signed)
Addendum  created 12/06/11 1325 by Shanon Payor, CRNA   Modules edited:Notes Section

## 2011-12-06 NOTE — Anesthesia Postprocedure Evaluation (Signed)
  Anesthesia Post-op Note  Patient: Rhonda Deleon  Procedure(s) Performed: Procedure(s) (LRB): ROBOTIC ASSISTED TOTAL HYSTERECTOMY (N/A) ROBOTIC ASSISTED LAPAROSCOPIC LYSIS OF ADHESION (N/A)  Patient Location: [facility]  Anesthesia Type: General  Level of Consciousness: awake, alert  and oriented  Airway and Oxygen Therapy: Patient Spontanous Breathing  Post-op Pain: none  Post-op Assessment: Post-op Vital signs reviewed and Patient's Cardiovascular Status Stable  Post-op Vital Signs: Reviewed and stable  Complications: No apparent anesthesia complications

## 2011-12-19 ENCOUNTER — Other Ambulatory Visit: Payer: Self-pay | Admitting: Obstetrics and Gynecology

## 2011-12-19 ENCOUNTER — Ambulatory Visit (HOSPITAL_COMMUNITY)
Admission: RE | Admit: 2011-12-19 | Discharge: 2011-12-19 | Disposition: A | Discharge status: Home / Self Care | Payer: BC Managed Care – PPO | Source: Ambulatory Visit | Attending: Obstetrics and Gynecology | Admitting: Obstetrics and Gynecology

## 2011-12-19 ENCOUNTER — Encounter (INDEPENDENT_AMBULATORY_CARE_PROVIDER_SITE_OTHER): Payer: BC Managed Care – PPO | Admitting: Obstetrics and Gynecology

## 2011-12-19 DIAGNOSIS — R0602 Shortness of breath: Secondary | ICD-10-CM

## 2011-12-19 DIAGNOSIS — R079 Chest pain, unspecified: Secondary | ICD-10-CM | POA: Insufficient documentation

## 2011-12-19 DIAGNOSIS — R0789 Other chest pain: Secondary | ICD-10-CM

## 2011-12-19 DIAGNOSIS — N800 Endometriosis of uterus: Secondary | ICD-10-CM

## 2012-01-17 ENCOUNTER — Encounter: Payer: Self-pay | Admitting: Obstetrics and Gynecology

## 2012-01-17 ENCOUNTER — Ambulatory Visit (INDEPENDENT_AMBULATORY_CARE_PROVIDER_SITE_OTHER): Payer: BC Managed Care – PPO | Admitting: Obstetrics and Gynecology

## 2012-01-17 VITALS — BP 136/78 | HR 72 | Wt 123.0 lb

## 2012-01-17 DIAGNOSIS — N800 Endometriosis of uterus: Secondary | ICD-10-CM

## 2012-01-17 NOTE — Progress Notes (Signed)
Surgery: Robotic hysterectomy Date: 12/05/11  Eating a regular diet without difficulty. Bowel movements are normal.  The patient is not having any pain.  Bladder function is returned to normal. Vaginal bleeding: spotting Vaginal discharge: no vaginal discharge    Subjective:     Rhonda Deleon is a 46 y.o. female who presents 6 weeks status post Robotic hysterectomy for adenomyosis.  The following portions of the patient's history were reviewed and updated as appropriate: allergies, current medications, past family history, past medical history, past social history, past surgical history and problem list.  Review of Systems Pertinent items are noted in HPI.   Objective:    BP 136/78  Pulse 72  Wt 123 lb (55.792 kg)  LMP 11/18/2011 Weight:  Wt Readings from Last 1 Encounters:  01/17/12 123 lb (55.792 kg)    BMI: There is no height on file to calculate BMI.  General Appearance: Alert, appropriate appearance for age. No acute distress Incision/s: healing well Pelvic Exam: vaginal cuff healing well   Bimanual exam normal  Assessment:    Doing well postoperatively. Operative findings again reviewed. Pathology report discussed.    Plan:     2. Wound care discussed. 3. Activity restrictions: no sex for 2 more weeks 4. Anticipated return to work: not applicable. 5. Follow up: 1 year for AEX   Jonluke Cobbins A MD 4/24/20139:17 AM

## 2012-05-06 NOTE — Progress Notes (Signed)
Quick Note:  Please send "Dense breast" letter to patient and document in chart when letter is sent. ______ 

## 2012-05-07 ENCOUNTER — Encounter: Payer: Self-pay | Admitting: Obstetrics and Gynecology

## 2012-06-25 ENCOUNTER — Other Ambulatory Visit (HOSPITAL_COMMUNITY): Payer: Self-pay | Admitting: Internal Medicine

## 2012-06-25 DIAGNOSIS — J45909 Unspecified asthma, uncomplicated: Secondary | ICD-10-CM

## 2012-06-27 ENCOUNTER — Ambulatory Visit (HOSPITAL_COMMUNITY)
Admission: RE | Admit: 2012-06-27 | Discharge: 2012-06-27 | Disposition: A | Discharge status: Home / Self Care | Payer: BC Managed Care – PPO | Source: Ambulatory Visit | Attending: Internal Medicine | Admitting: Internal Medicine

## 2012-06-27 ENCOUNTER — Encounter (HOSPITAL_COMMUNITY): Payer: BC Managed Care – PPO

## 2012-06-27 DIAGNOSIS — J45909 Unspecified asthma, uncomplicated: Secondary | ICD-10-CM | POA: Insufficient documentation

## 2012-06-27 MED ORDER — METHACHOLINE 1 MG/ML NEB SOLN
2.0000 mL | Freq: Once | RESPIRATORY_TRACT | Status: DC
  Filled 2012-06-27: qty 2

## 2012-06-27 MED ORDER — SODIUM CHLORIDE 0.9 % IN NEBU
3.0000 mL | INHALATION_SOLUTION | Freq: Once | RESPIRATORY_TRACT | Status: AC
  Administered 2012-06-27: 3 mL via RESPIRATORY_TRACT
  Filled 2012-06-27: qty 3

## 2012-06-27 MED ORDER — METHACHOLINE 0.0625 MG/ML NEB SOLN
2.0000 mL | Freq: Once | RESPIRATORY_TRACT | Status: AC
  Administered 2012-06-27: 0.125 mg via RESPIRATORY_TRACT
  Filled 2012-06-27: qty 2

## 2012-06-27 MED ORDER — METHACHOLINE 4 MG/ML NEB SOLN
2.0000 mL | Freq: Once | RESPIRATORY_TRACT | Status: DC
  Filled 2012-06-27: qty 2

## 2012-06-27 MED ORDER — ALBUTEROL SULFATE (5 MG/ML) 0.5% IN NEBU
2.5000 mg | INHALATION_SOLUTION | Freq: Once | RESPIRATORY_TRACT | Status: AC
  Administered 2012-06-27: 2.5 mg via RESPIRATORY_TRACT

## 2012-06-27 MED ORDER — METHACHOLINE 0.25 MG/ML NEB SOLN
2.0000 mL | Freq: Once | RESPIRATORY_TRACT | Status: DC
  Filled 2012-06-27: qty 2

## 2012-06-27 MED ORDER — METHACHOLINE 16 MG/ML NEB SOLN
2.0000 mL | Freq: Once | RESPIRATORY_TRACT | Status: DC
  Filled 2012-06-27: qty 2

## 2013-01-06 ENCOUNTER — Other Ambulatory Visit: Payer: Self-pay | Admitting: Neurology

## 2013-01-09 ENCOUNTER — Other Ambulatory Visit: Payer: Self-pay | Admitting: Neurology

## 2013-01-09 ENCOUNTER — Other Ambulatory Visit: Payer: Self-pay

## 2013-01-09 MED ORDER — TRAMADOL HCL 50 MG PO TABS: 50.0000 mg | ORAL_TABLET | Freq: Four times a day (QID) | ORAL | Status: DC | PRN

## 2013-01-09 NOTE — Telephone Encounter (Signed)
Refill WID

## 2013-02-12 ENCOUNTER — Other Ambulatory Visit: Payer: Self-pay | Admitting: Neurology

## 2013-02-14 ENCOUNTER — Encounter: Payer: Self-pay | Admitting: Nurse Practitioner

## 2013-02-14 ENCOUNTER — Ambulatory Visit (INDEPENDENT_AMBULATORY_CARE_PROVIDER_SITE_OTHER): Payer: BC Managed Care – PPO | Admitting: Nurse Practitioner

## 2013-02-14 VITALS — BP 113/66 | HR 80 | Ht 64.5 in | Wt 119.0 lb

## 2013-02-14 DIAGNOSIS — G2589 Other specified extrapyramidal and movement disorders: Secondary | ICD-10-CM

## 2013-02-14 DIAGNOSIS — G249 Dystonia, unspecified: Secondary | ICD-10-CM | POA: Insufficient documentation

## 2013-02-14 DIAGNOSIS — G40219 Localization-related (focal) (partial) symptomatic epilepsy and epileptic syndromes with complex partial seizures, intractable, without status epilepticus: Secondary | ICD-10-CM

## 2013-02-14 DIAGNOSIS — G248 Other dystonia: Secondary | ICD-10-CM

## 2013-02-14 DIAGNOSIS — G43909 Migraine, unspecified, not intractable, without status migrainosus: Secondary | ICD-10-CM | POA: Insufficient documentation

## 2013-02-14 NOTE — Patient Instructions (Addendum)
Will switch to Oxtellar XR 600mg  tab (2) daily Continue other meds Call next week for prescription Followup yearly and when necessary

## 2013-02-14 NOTE — Progress Notes (Signed)
HPI: Patient returns for followup after last visit with Dr. Vickey Huger 11/24/2011. She has a nocturnal movement disorder and also migraines. She reports that her migraines have gotten much better she had a hysterectomy in March of 2013. She continues to take oxcarbamazepine for her movement disorder and she feels  like it wears off sometimes. Her labs are followed by her primary care physician. She is no longer taking Imitrex shots. She is no longer taking vitamin B12. She continues to take diclofenac and Flexeril when necessary.  ROS:  headache  Physical Exam General: well developed, well nourished, seated, in no evident distress Head: head normocephalic and atraumatic. Oropharynx benign Neck: supple with no carotid or supraclavicular bruits Cardiovascular: regular rate and rhythm, no murmurs  Neurologic Exam Mental Status: Awake and fully alert. Oriented to place and time. Follows all commands . Cranial Nerves:  Pupils equal, briskly reactive to light. Extraocular movements full without nystagmus. Visual fields full to confrontation. Hearing intact and symmetric to finger snap. Facial sensation intact. Face, tongue, palate move normally and symmetrically. Neck flexion and extension normal.  Motor: Normal bulk and tone. Normal strength in all tested extremity muscles. No focal weakness Sensory.: intact to touch and pinprick and vibratory.  Coordination: Rapid alternating movements normal in all extremities. Finger-to-nose and heel-to-shin performed accurately bilaterally. No dysmetria or ataxia Gait and Station: Arises from chair without difficulty. Stance is normal. Gait demonstrates normal stride length and balance . Able to heel, toe and tandem walk without difficulty.  Reflexes: 1+ and symmetric. Toes downgoing.     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

## 2013-02-18 ENCOUNTER — Telehealth: Payer: Self-pay | Admitting: Neurology

## 2013-02-18 MED ORDER — OXCARBAZEPINE ER 600 MG PO TB24: 1200.0000 mg | ORAL_TABLET | Freq: Every day | ORAL | Status: DC

## 2013-02-18 NOTE — Telephone Encounter (Signed)
Refill done and called in

## 2013-02-18 NOTE — Telephone Encounter (Signed)
Patient calling and would like for Oxtellar XR 600 mg to be called in. The patient states that it worked.

## 2013-02-19 NOTE — Telephone Encounter (Signed)
Called patient and left her a message that her meds were called in.

## 2013-05-09 ENCOUNTER — Other Ambulatory Visit: Payer: Self-pay | Admitting: Neurology

## 2013-07-29 ENCOUNTER — Other Ambulatory Visit: Payer: Self-pay | Admitting: Radiology

## 2013-08-07 ENCOUNTER — Ambulatory Visit (INDEPENDENT_AMBULATORY_CARE_PROVIDER_SITE_OTHER): Payer: BC Managed Care – PPO | Admitting: General Surgery

## 2013-08-07 ENCOUNTER — Encounter (INDEPENDENT_AMBULATORY_CARE_PROVIDER_SITE_OTHER): Payer: Self-pay | Admitting: General Surgery

## 2013-08-07 VITALS — BP 124/84 | HR 76 | Temp 98.6°F | Resp 15 | Ht 60.5 in | Wt 118.4 lb

## 2013-08-07 DIAGNOSIS — D249 Benign neoplasm of unspecified breast: Secondary | ICD-10-CM | POA: Insufficient documentation

## 2013-08-07 DIAGNOSIS — D242 Benign neoplasm of left breast: Secondary | ICD-10-CM

## 2013-08-07 NOTE — Progress Notes (Signed)
Patient ID: Rhonda Deleon, female   DOB: 1965/12/12, 47 y.o.   MRN: [id number]  Chief Complaint  Patient presents with  . New Evaluation    eval left br papilloma`    HPI Rhonda Deleon is a 47 y.o. female. She is referred by Dr. Andrey Deleon at [facility] for consideration of excision of a papilloma, left breast, peripheral location, 9:00 position. Her PCP is Dr. Quitman Deleon.  This patient is a reasonably healthy and has no prior history of breast problems. She felt a lump in her left breast medially recently. Mammogram and ultrasounds were performed. The only abnormal finding was an oval-shaped mass in the left breast at the 9:00 position 7 mm by ultrasound. She also had a small cyst in the upper outer quadrant of the left breast. At the time of image guided biopsy they said that this was somewhat spiculated. Pathology report from the biopsy shows intraductal papilloma. No atypia was mentioned. She was referred for excision.  History reveals breast cancer in a maternal grandmother but no other breast or ovarian or prostate cancer.  Comorbidities include psoriatic arthritis on Remicade. Last Remicade infusion October 11. Migraine headaches. Asthma. HPI  Past Medical History  Diagnosis Date  . Headache(784.0)     migraines  . Seizures     as child  . 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 Neuromuscular disorder     movement disorder  . Arthritis   . Asthma     Past Surgical History  Procedure Laterality Date  . Cesarean section  1991  . Wisdom tooth extraction  as child  . Robotic assisted lap vaginal hysterectomy  12/05/11    Family History  Problem Relation Age of Onset  . Hypertension Mother   . Cancer Maternal Grandmother     breast    Social History History  Substance Use Topics  . Smoking status: Former Smoker    Quit date: 09/25/1997  . Smokeless tobacco: Never  Used  . Alcohol Use: 1.0 oz/week    2 drink(s) per week     Comment: social    No Known Allergies  Current Outpatient Prescriptions  Medication Sig Dispense Refill  . Betamethasone Valerate 0.12 % foam       . chlorpheniramine-HYDROcodone (TUSSIONEX) 10-8 MG/5ML LQCR       . Diclofenac Potassium (ZIPSOR) 25 MG CAPS Take 25 mg by mouth 2 (two) times daily.      Marland Kitchen FLOVENT HFA 110 MCG/ACT inhaler       . fluticasone (FLONASE) 50 MCG/ACT nasal spray       . inFLIXimab (REMICADE) 100 MG injection Inject into the vein.      . methocarbamol (ROBAXIN) 750 MG tablet TAKE ONE TABLET BY MOUTH ONCE A DAY  30 tablet  3  . polyethylene glycol (MIRALAX / GLYCOLAX) packet Take 17 g by mouth daily.      . traMADol (ULTRAM) 50 MG tablet Take 1 tablet (50 mg total) by mouth every 6 (six) hours as needed for pain.  30 tablet  0  . VENTOLIN HFA 108 (90 BASE) MCG/ACT inhaler        No current facility-administered medications for this visit.    Review of Systems Review of Systems  Constitutional: Negative for fever, chills and unexpected weight change.  HENT: Negative for congestion, hearing loss, sore throat, trouble swallowing and voice change.   Eyes: Negative for visual  disturbance.  Respiratory: Negative for cough and wheezing.   Cardiovascular: Negative for chest pain, palpitations and leg swelling.  Gastrointestinal: Negative for nausea, vomiting, abdominal pain, diarrhea, constipation, blood in stool, abdominal distention and anal bleeding.  Genitourinary: Negative for hematuria, vaginal bleeding and difficulty urinating.  Musculoskeletal: Positive for arthralgias and myalgias.  Skin: Negative for rash and wound.  Neurological: Positive for headaches. Negative for seizures and syncope.  Hematological: Negative for adenopathy. Does not bruise/bleed easily.  Psychiatric/Behavioral: Negative for confusion.    Blood pressure 124/84, pulse 76, temperature 98.6 F (37 C), temperature source  Temporal, resp. rate 15, height 5' 0.5" (1.537 m), weight 118 lb 6.4 oz (53.706 kg), last menstrual period 11/18/2011.  Physical Exam Physical Exam  Constitutional: She is oriented to person, place, and time. She appears well-developed and well-nourished. No distress.  HENT:  Head: Normocephalic and atraumatic.  Nose: Nose normal.  Mouth/Throat: No oropharyngeal exudate.  Eyes: Conjunctivae and EOM are normal. Pupils are equal, round, and reactive to light. Left eye exhibits no discharge. No scleral icterus.  Neck: Neck supple. No JVD present. No tracheal deviation present. No thyromegaly present.  Cardiovascular: Normal rate, regular rhythm, normal heart sounds and intact distal pulses.   No murmur heard. Pulmonary/Chest: Effort normal and breath sounds normal. No respiratory distress. She has no wheezes. She has no rales. She exhibits no tenderness.    Small palpable mass in the left breast at the 8:30 position. This is very medial in one of the thinner areas of the breast. It is slightly irregular. No significant bruising. No hematoma.  Abdominal: Soft. Bowel sounds are normal. She exhibits no distension and no mass. There is no tenderness. There is no rebound and no guarding.  Musculoskeletal: She exhibits no edema and no tenderness.  Lymphadenopathy:    She has no cervical adenopathy.  Neurological: She is alert and oriented to person, place, and time. She exhibits normal muscle tone. Coordination normal.  Skin: Skin is warm. No rash noted. She is not diaphoretic. No erythema. No pallor.  Psychiatric: She has a normal mood and affect. Her behavior is normal. Judgment and thought content normal.    Data Reviewed Imaging studies and pathology report  Assessment    Intraductal papilloma left breast, peripheral, 8:30 position. Excision is indicated to exclude occult noninvasive cancer  Psoriatic arthritis on Remicade infusions periodically.  Migraine headaches  Asthma      Plan    Scheduled for a left lumpectomy with needle localization and margin assessment in the near future  I have discussed the indications, details, techniques, and numerous risks of the surgery with the patient and her husband. They're aware of the risk of bleeding, infection, cosmetic deformity, nerve damage with chronic pain, reoperation if malignant with positive margins, and other unforeseen problems. She understands all these issues well. All of her questions are answered. She agrees with this plan.        Angelia Mould. Derrell Lolling, M.D., [facility] General and Minimally invasive Surgery Breast and Colorectal Surgery Office:   [phone] Pager:   [phone]  08/07/2013, 10:17 AM

## 2013-08-07 NOTE — Patient Instructions (Signed)
The palpable mass in your left breast at the 9:00 position was biopsied and shows a benign intraductal papilloma. There is a small possibility that there could be early, noninvasive breast cancer in the area.This area should be completely excised for complete pathologic examination.  You will be scheduled for a left lumpectomy with needle localization in the near future.     Lumpectomy A lumpectomy is a form of "breast conserving" or "breast preservation" surgery. It may also be referred to as a partial mastectomy. During a lumpectomy, the portion of the breast that contains the cancerous tumor or breast mass (the lump) is removed. Some normal tissue around the lump may also be removed to make sure all the tumor has been removed. This surgery should take 40 minutes or less. LET [facility] CARE PROVIDER KNOW ABOUT:  Any allergies you have.  All medicines you are taking, including vitamins, herbs, eye drops, creams, and over-the-counter medicines.  Previous problems you or members of your family have had with the use of anesthetics.  Any blood disorders you have.  Previous surgeries you have had.  Medical conditions you have. RISKS AND COMPLICATIONS Generally, this is a safe procedure. However, as with any procedure, complications can occur. Possible complications include:  Bleeding.  Infection.  Pain.  Temporary swelling.  Change in the shape of the breast, particularly if a large portion is removed. BEFORE THE PROCEDURE  Ask your health care provider about changing or stopping your regular medicines.  Do not eat or drink anything for 7 8 hours before the surgery or as directed by your health care provider. Ask your health care provider if you can take a sip of water with any approved medicines.  On the day of surgery, your healthcare provider will use a mammogram or ultrasound to locate and mark the tumor in your breast. These markings on your breast will show where the cut  (incision) will be made. PROCEDURE   An IV tube will be put into one of your veins.  You may be given medicine to help you relax before the surgery (sedative). You will be given one of the following:  A medicine that numbs the area (local anesthesia).  A medicine that makes you go to sleep (general anesthesia).  Your health care provider will use a kind of electric scalpel that uses heat to minimize bleeding (electrocautery knife).  A curved incision (like a smile or frown) that follows the natural curve of your breast is made, to allow for minimal scarring and better healing.  The tumor will be removed with some of the surrounding tissue. This will be sent to the lab for analysis. Your health care provider may also remove your lymph nodes at this time if needed.  Sometimes, but not always, a rubber tube called a drain will be surgically inserted into your breast area or armpit to collect excess fluid that may accumulate in the space where the tumor was. This drain is connected to a plastic bulb on the outside of your body. This drain creates suction to help remove the fluid.  The incisions will be closed with stitches (sutures).  A bandage may be placed over the incisions. AFTER THE PROCEDURE  You will be taken to the recovery area.  You will be given medicine for pain.  A small rubber drain may be placed in the breast for 2 3 days to prevent a collection of blood (hematoma) from developing in the breast. You will be given instructions on  caring for the drain before you go home.  A pressure bandage (dressing) will be applied for 1 2 days to prevent bleeding. Ask your health care provider how to care for your bandage at home. Document Released: 10/23/2006 Document Revised: 05/14/2013 Document Reviewed: 02/14/2013 [facility] Patient Information 2014 [place], [place].

## 2013-08-11 ENCOUNTER — Encounter (INDEPENDENT_AMBULATORY_CARE_PROVIDER_SITE_OTHER): Payer: Self-pay

## 2013-08-11 ENCOUNTER — Encounter (HOSPITAL_BASED_OUTPATIENT_CLINIC_OR_DEPARTMENT_OTHER): Payer: Self-pay | Admitting: *Deleted

## 2013-08-11 NOTE — Progress Notes (Signed)
No labs needed

## 2013-08-12 NOTE — H&P (Signed)
Rhonda Deleon   MRN:  [id number]   Description: 47 year old female  Provider: Ernestene Mention, MD  Department: [facility]        Diagnoses    Papilloma of breast, left    -  Primary    217      Reason for Visit    New Evaluation    eval left br papilloma`        Current Vitals - Last Recorded    BP Pulse Temp(Src) Resp Ht Wt    124/84 76 98.6 F (37 C) (Temporal) 15 5' 0.5" (1.537 m) 118 lb 6.4 oz (53.706 kg)       BMI 22.73 kg/m2 11/18/2011                   History and Physical   Ernestene Mention, MD    Status: Signed            Patient ID: Rhonda Deleon, female   DOB: 1965/12/27, 47 y.o.   MRN: [id number]              HPI Rhonda Deleon is a 47 y.o. female. She is referred by Dr. Rogelia Mire at [facility] for consideration of excision of a papilloma, left breast, peripheral location, 9:00 position. Her PCP is Dr. Quitman Livings.   This patient is a reasonably healthy and has no prior history of breast problems. She felt a lump in her left breast medially recently. Mammogram and ultrasounds were performed. The only abnormal finding was an oval-shaped mass in the left breast at the 9:00 position 7 mm by ultrasound. She also had a small cyst in the upper outer quadrant of the left breast. At the time of image guided biopsy they said that this was somewhat spiculated. Pathology report from the biopsy shows intraductal papilloma. No atypia was mentioned. She was referred for excision.   History reveals breast cancer in a maternal grandmother but no other breast or ovarian or prostate cancer.   Comorbidities include psoriatic arthritis on Remicade. Last Remicade infusion October 11. Migraine headaches. Asthma.       Past Medical History   Diagnosis  Date   .  Headache(784.0)         migraines   .  Seizures         as child   .  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  Neuromuscular disorder         movement disorder   .  Arthritis     .  Asthma          Past Surgical History   Procedure  Laterality  Date   .  Cesarean section    1991   .  Wisdom tooth extraction    as child   .  Robotic assisted lap vaginal hysterectomy    12/05/11        Family History   Problem  Relation  Age of Onset   .  Hypertension  Mother     .  Cancer  Maternal Grandmother         breast       Social History History   Substance Use Topics   .  Smoking status:  Former Smoker       Quit date:  09/25/1997   .  Smokeless tobacco:  Never  Used   .  Alcohol Use:  1.0 oz/week       2 drink(s) per week         Comment: social        No Known Allergies    Current Outpatient Prescriptions   Medication  Sig  Dispense  Refill   .  Betamethasone Valerate 0.12 % foam           .  chlorpheniramine-HYDROcodone (TUSSIONEX) 10-8 MG/5ML LQCR           .  Diclofenac Potassium (ZIPSOR) 25 MG CAPS  Take 25 mg by mouth 2 (two) times daily.         Marland Kitchen  FLOVENT HFA 110 MCG/ACT inhaler           .  fluticasone (FLONASE) 50 MCG/ACT nasal spray           .  inFLIXimab (REMICADE) 100 MG injection  Inject into the vein.         .  methocarbamol (ROBAXIN) 750 MG tablet  TAKE ONE TABLET BY MOUTH ONCE A DAY   30 tablet   3   .  polyethylene glycol (MIRALAX / GLYCOLAX) packet  Take 17 g by mouth daily.         .  traMADol (ULTRAM) 50 MG tablet  Take 1 tablet (50 mg total) by mouth every 6 (six) hours as needed for pain.   30 tablet   0   .  VENTOLIN HFA 108 (90 BASE) MCG/ACT inhaler                 Review of Systems  Constitutional: Negative for fever, chills and unexpected weight change.  HENT: Negative for congestion, hearing loss, sore throat, trouble swallowing and voice change.   Eyes: Negative for visual disturbance.  Respiratory: Negative for cough and wheezing.   Cardiovascular: Negative for chest pain, palpitations and leg swelling.   Gastrointestinal: Negative for nausea, vomiting, abdominal pain, diarrhea, constipation, blood in stool, abdominal distention and anal bleeding.  Genitourinary: Negative for hematuria, vaginal bleeding and difficulty urinating.  Musculoskeletal: Positive for arthralgias and myalgias.  Skin: Negative for rash and wound.  Neurological: Positive for headaches. Negative for seizures and syncope.  Hematological: Negative for adenopathy. Does not bruise/bleed easily.  Psychiatric/Behavioral: Negative for confusion.      Blood pressure 124/84, pulse 76, temperature 98.6 F (37 C), temperature source Temporal, resp. rate 15, height 5' 0.5" (1.537 m), weight 118 lb 6.4 oz (53.706 kg), last menstrual period 11/18/2011.   Physical Exam  Constitutional: She is oriented to person, place, and time. She appears well-developed and well-nourished. No distress.  HENT:   Head: Normocephalic and atraumatic.   Nose: Nose normal.   Mouth/Throat: No oropharyngeal exudate.  Eyes: Conjunctivae and EOM are normal. Pupils are equal, round, and reactive to light. Left eye exhibits no discharge. No scleral icterus.  Neck: Neck supple. No JVD present. No tracheal deviation present. No thyromegaly present.  Cardiovascular: Normal rate, regular rhythm, normal heart sounds and intact distal pulses.    No murmur heard. Pulmonary/Chest: Effort normal and breath sounds normal. No respiratory distress. She has no wheezes. She has no rales. She exhibits no tenderness.    Small palpable mass in the left breast at the 8:30 position. This is very medial in one of the thinner areas of the breast. It is slightly irregular. No significant bruising. No hematoma.  Abdominal: Soft. Bowel sounds are normal. She exhibits no distension and  no mass. There is no tenderness. There is no rebound and no guarding.  Musculoskeletal: She exhibits no edema and no tenderness.  Lymphadenopathy:    She has no cervical adenopathy.   Neurological: She is alert and oriented to person, place, and time. She exhibits normal muscle tone. Coordination normal.  Skin: Skin is warm. No rash noted. She is not diaphoretic. No erythema. No pallor.  Psychiatric: She has a normal mood and affect. Her behavior is normal. Judgment and thought content normal.      Data Reviewed Imaging studies and pathology report   Assessment    Intraductal papilloma left breast, peripheral, 8:30 position. Excision is indicated to exclude occult noninvasive cancer   Psoriatic arthritis on Remicade infusions periodically.   Migraine headaches   Asthma      Plan    Scheduled for a left lumpectomy with needle localization and margin assessment in the near future   I have discussed the indications, details, techniques, and numerous risks of the surgery with the patient and her husband. They're aware of the risk of bleeding, infection, cosmetic deformity, nerve damage with chronic pain, reoperation if malignant with positive margins, and other unforeseen problems. She understands all these issues well. All of her questions are answered. She agrees with this plan.           Angelia Mould. Derrell Lolling, M.D., [facility] General and Minimally invasive Surgery Breast and Colorectal Surgery Office:   [phone] Pager:   [phone]

## 2013-08-13 ENCOUNTER — Encounter (INDEPENDENT_AMBULATORY_CARE_PROVIDER_SITE_OTHER): Payer: Self-pay

## 2013-08-15 ENCOUNTER — Ambulatory Visit (HOSPITAL_BASED_OUTPATIENT_CLINIC_OR_DEPARTMENT_OTHER): Payer: BC Managed Care – PPO | Admitting: Anesthesiology

## 2013-08-15 ENCOUNTER — Encounter (HOSPITAL_BASED_OUTPATIENT_CLINIC_OR_DEPARTMENT_OTHER): Payer: Self-pay | Admitting: *Deleted

## 2013-08-15 ENCOUNTER — Encounter (HOSPITAL_BASED_OUTPATIENT_CLINIC_OR_DEPARTMENT_OTHER): Payer: BC Managed Care – PPO | Admitting: Anesthesiology

## 2013-08-15 ENCOUNTER — Encounter (HOSPITAL_BASED_OUTPATIENT_CLINIC_OR_DEPARTMENT_OTHER)
Admission: RE | Disposition: A | Discharge status: Home / Self Care | Payer: Self-pay | Source: Ambulatory Visit | Attending: General Surgery

## 2013-08-15 ENCOUNTER — Ambulatory Visit (HOSPITAL_BASED_OUTPATIENT_CLINIC_OR_DEPARTMENT_OTHER)
Admission: RE | Admit: 2013-08-15 | Discharge: 2013-08-15 | Disposition: A | Discharge status: Home / Self Care | Payer: BC Managed Care – PPO | Source: Ambulatory Visit | Attending: General Surgery | Admitting: General Surgery

## 2013-08-15 DIAGNOSIS — R569 Unspecified convulsions: Secondary | ICD-10-CM | POA: Insufficient documentation

## 2013-08-15 DIAGNOSIS — D249 Benign neoplasm of unspecified breast: Secondary | ICD-10-CM | POA: Insufficient documentation

## 2013-08-15 DIAGNOSIS — J45909 Unspecified asthma, uncomplicated: Secondary | ICD-10-CM | POA: Insufficient documentation

## 2013-08-15 DIAGNOSIS — D242 Benign neoplasm of left breast: Secondary | ICD-10-CM

## 2013-08-15 DIAGNOSIS — L4050 Arthropathic psoriasis, unspecified: Secondary | ICD-10-CM | POA: Insufficient documentation

## 2013-08-15 DIAGNOSIS — G255 Other chorea: Secondary | ICD-10-CM | POA: Insufficient documentation

## 2013-08-15 DIAGNOSIS — N6019 Diffuse cystic mastopathy of unspecified breast: Secondary | ICD-10-CM

## 2013-08-15 DIAGNOSIS — Z87891 Personal history of nicotine dependence: Secondary | ICD-10-CM | POA: Insufficient documentation

## 2013-08-15 DIAGNOSIS — N6089 Other benign mammary dysplasias of unspecified breast: Secondary | ICD-10-CM | POA: Insufficient documentation

## 2013-08-15 DIAGNOSIS — G709 Myoneural disorder, unspecified: Secondary | ICD-10-CM | POA: Insufficient documentation

## 2013-08-15 DIAGNOSIS — Z803 Family history of malignant neoplasm of breast: Secondary | ICD-10-CM | POA: Insufficient documentation

## 2013-08-15 DIAGNOSIS — G43909 Migraine, unspecified, not intractable, without status migrainosus: Secondary | ICD-10-CM | POA: Insufficient documentation

## 2013-08-15 HISTORY — PX: BREAST LUMPECTOMY WITH NEEDLE LOCALIZATION: SHX5759

## 2013-08-15 LAB — POCT HEMOGLOBIN-HEMACUE: Hemoglobin: 14.1 g/dL (ref 12.0–15.0)

## 2013-08-15 SURGERY — BREAST LUMPECTOMY WITH NEEDLE LOCALIZATION
Anesthesia: General | Site: Breast | Laterality: Left | Wound class: Clean

## 2013-08-15 MED ORDER — LACTATED RINGERS IV SOLN
INTRAVENOUS | Status: DC
  Administered 2013-08-15 (×2): via INTRAVENOUS

## 2013-08-15 MED ORDER — FENTANYL CITRATE 0.05 MG/ML IJ SOLN
25.0000 ug | INTRAMUSCULAR | Status: DC | PRN
  Administered 2013-08-15: 25 ug via INTRAVENOUS

## 2013-08-15 MED ORDER — FENTANYL CITRATE 0.05 MG/ML IJ SOLN
INTRAMUSCULAR | Status: AC
  Filled 2013-08-15: qty 6

## 2013-08-15 MED ORDER — CEFAZOLIN SODIUM-DEXTROSE 2-3 GM-% IV SOLR
2.0000 g | INTRAVENOUS | Status: AC
  Administered 2013-08-15: 2 g via INTRAVENOUS

## 2013-08-15 MED ORDER — PROMETHAZINE HCL 25 MG/ML IJ SOLN: 6.2500 mg | INTRAMUSCULAR | Status: DC | PRN

## 2013-08-15 MED ORDER — LIDOCAINE HCL (CARDIAC) 20 MG/ML IV SOLN
INTRAVENOUS | Status: DC | PRN
  Administered 2013-08-15: 100 mg via INTRAVENOUS

## 2013-08-15 MED ORDER — HYDROCODONE-ACETAMINOPHEN 5-325 MG PO TABS: 1.0000 | ORAL_TABLET | ORAL | Status: DC | PRN

## 2013-08-15 MED ORDER — BUPIVACAINE-EPINEPHRINE PF 0.5-1:200000 % IJ SOLN
INTRAMUSCULAR | Status: AC
  Filled 2013-08-15: qty 30

## 2013-08-15 MED ORDER — CHLORHEXIDINE GLUCONATE 4 % EX LIQD: 1.0000 "application " | Freq: Once | CUTANEOUS | Status: DC

## 2013-08-15 MED ORDER — PROPOFOL 10 MG/ML IV EMUL
INTRAVENOUS | Status: AC
  Filled 2013-08-15: qty 50

## 2013-08-15 MED ORDER — FENTANYL CITRATE 0.05 MG/ML IJ SOLN
INTRAMUSCULAR | Status: DC | PRN
  Administered 2013-08-15: 100 ug via INTRAVENOUS
  Administered 2013-08-15 (×2): 50 ug via INTRAVENOUS

## 2013-08-15 MED ORDER — MIDAZOLAM HCL 2 MG/2ML IJ SOLN: 0.5000 mg | Freq: Once | INTRAMUSCULAR | Status: DC | PRN

## 2013-08-15 MED ORDER — OXYCODONE HCL 5 MG PO TABS: 5.0000 mg | ORAL_TABLET | Freq: Once | ORAL | Status: DC | PRN

## 2013-08-15 MED ORDER — FENTANYL CITRATE 0.05 MG/ML IJ SOLN
INTRAMUSCULAR | Status: AC
  Filled 2013-08-15: qty 2

## 2013-08-15 MED ORDER — MIDAZOLAM HCL 2 MG/2ML IJ SOLN: 0.5000 mg | INTRAMUSCULAR | Status: DC | PRN

## 2013-08-15 MED ORDER — MIDAZOLAM HCL 2 MG/2ML IJ SOLN
INTRAMUSCULAR | Status: AC
  Filled 2013-08-15: qty 2

## 2013-08-15 MED ORDER — CEFAZOLIN SODIUM-DEXTROSE 2-3 GM-% IV SOLR
INTRAVENOUS | Status: AC
  Filled 2013-08-15: qty 50

## 2013-08-15 MED ORDER — BUPIVACAINE-EPINEPHRINE 0.5% -1:200000 IJ SOLN
INTRAMUSCULAR | Status: DC | PRN
  Administered 2013-08-15: 10 mL

## 2013-08-15 MED ORDER — OXYCODONE HCL 5 MG/5ML PO SOLN: 5.0000 mg | Freq: Once | ORAL | Status: DC | PRN

## 2013-08-15 MED ORDER — PROPOFOL 10 MG/ML IV BOLUS
INTRAVENOUS | Status: DC | PRN
  Administered 2013-08-15: 200 mg via INTRAVENOUS

## 2013-08-15 MED ORDER — LACTATED RINGERS IV SOLN
INTRAVENOUS | Status: DC
  Administered 2013-08-15: 11:00:00 via INTRAVENOUS

## 2013-08-15 MED ORDER — MEPERIDINE HCL 25 MG/ML IJ SOLN: 6.2500 mg | INTRAMUSCULAR | Status: DC | PRN

## 2013-08-15 MED ORDER — DEXAMETHASONE SODIUM PHOSPHATE 4 MG/ML IJ SOLN
INTRAMUSCULAR | Status: DC | PRN
  Administered 2013-08-15: 10 mg via INTRAVENOUS

## 2013-08-15 MED ORDER — MIDAZOLAM HCL 5 MG/5ML IJ SOLN
INTRAMUSCULAR | Status: DC | PRN
  Administered 2013-08-15: 2 mg via INTRAVENOUS

## 2013-08-15 MED ORDER — FENTANYL CITRATE 0.05 MG/ML IJ SOLN: 50.0000 ug | INTRAMUSCULAR | Status: DC | PRN

## 2013-08-15 SURGICAL SUPPLY — 61 items
ADH SKN CLS APL DERMABOND .7 (GAUZE/BANDAGES/DRESSINGS)
APL SKNCLS STERI-STRIP NONHPOA (GAUZE/BANDAGES/DRESSINGS)
APPLIER CLIP 11 MED OPEN (CLIP) ×2
APR CLP MED 11 20 MLT OPN (CLIP) ×1
BANDAGE ELASTIC 6 VELCRO ST LF (GAUZE/BANDAGES/DRESSINGS) IMPLANT
BENZOIN TINCTURE PRP APPL 2/3 (GAUZE/BANDAGES/DRESSINGS) IMPLANT
BLADE HEX COATED 2.75 (ELECTRODE) ×2 IMPLANT
BLADE SURG 10 STRL SS (BLADE) IMPLANT
BLADE SURG 15 STRL LF DISP TIS (BLADE) ×2 IMPLANT
BLADE SURG 15 STRL SS (BLADE) ×4
CANISTER SUCT 1200ML W/VALVE (MISCELLANEOUS) ×2 IMPLANT
CHLORAPREP W/TINT 26ML (MISCELLANEOUS) ×2 IMPLANT
CLIP APPLIE 11 MED OPEN (CLIP) ×1 IMPLANT
COVER MAYO STAND STRL (DRAPES) ×2 IMPLANT
COVER TABLE BACK 60X90 (DRAPES) ×2 IMPLANT
DECANTER SPIKE VIAL GLASS SM (MISCELLANEOUS) IMPLANT
DERMABOND ADVANCED (GAUZE/BANDAGES/DRESSINGS)
DERMABOND ADVANCED .7 DNX12 (GAUZE/BANDAGES/DRESSINGS) IMPLANT
DEVICE DUBIN W/COMP PLATE 8390 (MISCELLANEOUS) ×2 IMPLANT
DRAIN CHANNEL 19F RND (DRAIN) IMPLANT
DRAIN HEMOVAC 1/8 X 5 (WOUND CARE) IMPLANT
DRAPE LAPAROSCOPIC ABDOMINAL (DRAPES) ×2 IMPLANT
DRAPE UTILITY XL STRL (DRAPES) ×2 IMPLANT
DRSG PAD ABDOMINAL 8X10 ST (GAUZE/BANDAGES/DRESSINGS) IMPLANT
ELECT REM PT RETURN 9FT ADLT (ELECTROSURGICAL) ×2
ELECTRODE REM PT RTRN 9FT ADLT (ELECTROSURGICAL) ×1 IMPLANT
EVACUATOR SILICONE 100CC (DRAIN) IMPLANT
GAUZE SPONGE 4X4 12PLY STRL LF (GAUZE/BANDAGES/DRESSINGS) IMPLANT
GAUZE SPONGE 4X4 16PLY XRAY LF (GAUZE/BANDAGES/DRESSINGS) IMPLANT
GLOVE BIO SURGEON STRL SZ 6.5 (GLOVE) ×1 IMPLANT
GLOVE BIOGEL PI IND STRL 7.0 (GLOVE) IMPLANT
GLOVE BIOGEL PI INDICATOR 7.0 (GLOVE) ×1
GLOVE EUDERMIC 7 POWDERFREE (GLOVE) ×2 IMPLANT
GOWN PREVENTION PLUS XLARGE (GOWN DISPOSABLE) ×2 IMPLANT
GOWN PREVENTION PLUS XXLARGE (GOWN DISPOSABLE) ×2 IMPLANT
KIT MARKER MARGIN INK (KITS) ×2 IMPLANT
NDL HYPO 25X1 1.5 SAFETY (NEEDLE) ×1 IMPLANT
NEEDLE HYPO 25X1 1.5 SAFETY (NEEDLE) ×2 IMPLANT
NS IRRIG 1000ML POUR BTL (IV SOLUTION) ×2 IMPLANT
PACK BASIN DAY SURGERY FS (CUSTOM PROCEDURE TRAY) ×2 IMPLANT
PAD ALCOHOL SWAB (MISCELLANEOUS) ×1 IMPLANT
PENCIL BUTTON HOLSTER BLD 10FT (ELECTRODE) ×2 IMPLANT
PIN SAFETY STERILE (MISCELLANEOUS) IMPLANT
SLEEVE SCD COMPRESS KNEE MED (MISCELLANEOUS) ×1 IMPLANT
SPONGE GAUZE 4X4 12PLY (GAUZE/BANDAGES/DRESSINGS) ×2 IMPLANT
SPONGE LAP 18X18 X RAY DECT (DISPOSABLE) IMPLANT
SPONGE LAP 4X18 X RAY DECT (DISPOSABLE) ×2 IMPLANT
STRIP CLOSURE SKIN 1/2X4 (GAUZE/BANDAGES/DRESSINGS) IMPLANT
SUT ETHILON 3 0 FSL (SUTURE) IMPLANT
SUT MNCRL AB 4-0 PS2 18 (SUTURE) ×3 IMPLANT
SUT SILK 2 0 SH (SUTURE) ×2 IMPLANT
SUT VIC AB 2-0 CT1 27 (SUTURE)
SUT VIC AB 2-0 CT1 TAPERPNT 27 (SUTURE) IMPLANT
SUT VIC AB 3-0 SH 27 (SUTURE)
SUT VIC AB 3-0 SH 27X BRD (SUTURE) IMPLANT
SUT VICRYL 3-0 CR8 SH (SUTURE) ×2 IMPLANT
SYR CONTROL 10ML LL (SYRINGE) ×2 IMPLANT
TOWEL OR 17X24 6PK STRL BLUE (TOWEL DISPOSABLE) ×4 IMPLANT
TOWEL OR NON WOVEN STRL DISP B (DISPOSABLE) ×2 IMPLANT
TUBE CONNECTING 20X1/4 (TUBING) ×2 IMPLANT
YANKAUER SUCT BULB TIP NO VENT (SUCTIONS) ×2 IMPLANT

## 2013-08-15 NOTE — Anesthesia Procedure Notes (Signed)
Procedure Name: LMA Insertion Date/Time: 08/15/2013 10:44 AM Performed by: Zenia Resides D Pre-anesthesia Checklist: Patient identified, Emergency Drugs available, Suction available and Patient being monitored Patient Re-evaluated:Patient Re-evaluated prior to inductionOxygen Delivery Method: Circle System Utilized Preoxygenation: Pre-oxygenation with 100% oxygen Intubation Type: IV induction Ventilation: Mask ventilation without difficulty LMA: LMA inserted LMA Size: 4.0 Number of attempts: 1 Airway Equipment and Method: bite block Placement Confirmation: positive ETCO2 Tube secured with: Tape Dental Injury: Teeth and Oropharynx as per pre-operative assessment

## 2013-08-15 NOTE — Anesthesia Preprocedure Evaluation (Addendum)
Anesthesia Evaluation  Patient identified by MRN, date of birth, ID band Patient awake    Reviewed: Allergy & Precautions, H&P , NPO status , Patient's Chart, lab work & pertinent test results  History of Anesthesia Complications Negative for: history of anesthetic complications  Airway Mallampati: I TM Distance: >3 FB Neck ROM: Full    Dental  (+) Teeth Intact and Dental Advisory Given   Pulmonary asthma (last inhaler use a week ago) , former smoker (quit 1999),  breath sounds clear to auscultation  Pulmonary exam normal       Cardiovascular negative cardio ROS  Rhythm:Regular Rate:Normal     Neuro/Psych  Headaches, Seizures - (no seizure since childhood), Well Controlled,   Neuromuscular disease (extrapyramidal movement disorder)    GI/Hepatic negative GI ROS, Neg liver ROS,   Endo/Other  negative endocrine ROS  Renal/GU negative Renal ROS     Musculoskeletal   Abdominal   Peds  Hematology negative hematology ROS (+)   Anesthesia Other Findings   Reproductive/Obstetrics TAH                          Anesthesia Physical Anesthesia Plan  ASA: II  Anesthesia Plan: General   Post-op Pain Management:    Induction: Intravenous  Airway Management Planned: LMA  Additional Equipment:   Intra-op Plan:   Post-operative Plan:   Informed Consent: I have reviewed the patients History and Physical, chart, labs and discussed the procedure including the risks, benefits and alternatives for the proposed anesthesia with the patient or authorized representative who has indicated his/her understanding and acceptance.   Dental advisory given  Plan Discussed with: Surgeon and CRNA  Anesthesia Plan Comments: (Plan routine monitors, GA- LMA OK)        Anesthesia Quick Evaluation

## 2013-08-15 NOTE — Anesthesia Postprocedure Evaluation (Signed)
  Anesthesia Post-op Note  Patient: Rhonda Deleon  Procedure(s) Performed: Procedure(s): BREAST LUMPECTOMY WITH NEEDLE LOCALIZATION (Left)  Patient Location: PACU  Anesthesia Type:General  Level of Consciousness: awake, alert , oriented and patient cooperative  Airway and Oxygen Therapy: Patient Spontanous Breathing  Post-op Pain: none  Post-op Assessment: Post-op Vital signs reviewed, Patient's Cardiovascular Status Stable, Respiratory Function Stable, Patent Airway, No signs of Nausea or vomiting and Pain level controlled  Post-op Vital Signs: Reviewed and stable  Complications: No apparent anesthesia complications

## 2013-08-15 NOTE — Op Note (Signed)
Patient Name:           Marquerite Forsman   Date of Surgery:        08/15/2013   Pre op Diagnosis:   Intraductal papilloma left breast, peripheral, 8:30 position. Excision is indicated to exclude occult noninvasive cance        Post op Diagnosis:    Same   Procedure:                 Left partial mastectomy with needle localization and margin assessment  Surgeon:                     Angelia Mould. Derrell Lolling, M.D., FACS  Assistant:                      None  Operative Indications:   Sweet Jarvis is a 47 y.o. female. She is referred by Dr. Rogelia Mire at [facility] health for consideration of excision of a papilloma, left breast, peripheral location, 9:00 position. Her PCP is Dr. Quitman Livings.  This patient is a reasonably healthy, has psoriatic arthritis,and has no prior history of breast problems. She felt a lump in her left breast medially recently. Mammogram and ultrasounds were performed. The only abnormal finding was an oval-shaped mass in the left breast at the 9:00 position 7 mm by ultrasound. She also had a small cyst in the upper outer quadrant of the left breast. At the time of image guided biopsy they said that this was somewhat spiculated. Pathology report from the biopsy shows intraductal papilloma. No atypia was mentioned. She was referred for excision.  History reveals breast cancer in a maternal grandmother but no other breast or ovarian or prostate cancer.   Operative Findings:       The lumpectomy specimen contained the clip and the wire. The clip was in a very superficial location. Dr. Yolanda Bonine  stated that she thought that the wire was going through the mass.  Procedure in Detail:          Wire localization was performed at [employer]. The wire entered the skin below the inframammary crease medially and was directed superiorly. There was an "X" marking the skin overlying the mass. The patient was brought to the operating room. Gen. Anesthesia with LMA device was  induced. The left breast was prepped and draped in a sterile fashion. Surgical time out was performed. 0.5% Marcaine with epinephrine was used as a local infiltration anesthetic. A transverse incision was made in the left breast at about the 8:00 position, over the marked spot. Because the breast is tiny and the marker clip was very superficial I immediately raised skin flaps in the subdermal location and then dissected around the wire and the marker clip down to the pectoralis fascia. I could see the marker clip in a very superficial location, but there was no more breast tissue to excise around this. The specimen was marked with silk sutures to orient the pathologist. The specimen mammogram demonstrated the marker clip and the wire in the specimen and this was discussed with Dr. Yolanda Bonine, who felt that the wire went through the papilloma. The specimen was marked and sent to the lab. Hemostasis was excellent and achieved with electrocautery. The breast tissue was closed with interrupted sutures of 3-0 Vicryl and the skin closed with a running subcuticular suture of 4-0 Monocryl and Dermabond. The patient tolerated the procedure well. She was taken to the recovery. EBL  10 cc or less. Counts correct. Complications none.     Angelia Mould. Derrell Lolling, M.D., FACS General and Minimally Invasive Surgery Breast and Colorectal Surgery  08/15/2013 11:27 AM

## 2013-08-15 NOTE — Interval H&P Note (Signed)
History and Physical Interval Note:  08/15/2013 10:17 AM  Rhonda Deleon  has presented today for surgery, with the diagnosis of lt breast papilloma  The goals and the various methods of treatment have been discussed with the patient and family. After consideration of risks, benefits and other options for treatment, the patient has consented to  Procedure(s): BREAST LUMPECTOMY WITH NEEDLE LOCALIZATION (Left) as a surgical intervention .  The patient's history has been reviewed, patient examined today, no change in status, stable for surgery.  I have reviewed the patient's chart and labs.  Questions were answered to the patient's satisfaction.     Ernestene Mention

## 2013-08-15 NOTE — Transfer of Care (Signed)
Immediate Anesthesia Transfer of Care Note  Patient: Rhonda Deleon  Procedure(s) Performed: Procedure(s): BREAST LUMPECTOMY WITH NEEDLE LOCALIZATION (Left)  Patient Location: PACU  Anesthesia Type:General  Level of Consciousness: awake, alert  and oriented  Airway & Oxygen Therapy: Patient Spontanous Breathing and Patient connected to face mask oxygen  Post-op Assessment: Report given to PACU RN and Post -op Vital signs reviewed and stable  Post vital signs: Reviewed and stable  Complications: No apparent anesthesia complications

## 2013-08-18 ENCOUNTER — Encounter (HOSPITAL_BASED_OUTPATIENT_CLINIC_OR_DEPARTMENT_OTHER): Payer: Self-pay | Admitting: General Surgery

## 2013-08-18 NOTE — Progress Notes (Signed)
Quick Note:  Inform patient of Pathology report,.No malignancy. Benign intraductal papilloma. Good news.  hmi ______

## 2013-08-19 ENCOUNTER — Telehealth (INDEPENDENT_AMBULATORY_CARE_PROVIDER_SITE_OTHER): Payer: Self-pay

## 2013-08-19 NOTE — Telephone Encounter (Signed)
Patient called back and given below message.  Patient states understanding.

## 2013-08-19 NOTE — Telephone Encounter (Signed)
Left message for pt to call and get pathology results below.

## 2013-08-19 NOTE — Telephone Encounter (Signed)
Message copied by Ivory Broad on Tue Aug 19, 2013  9:10 AM ------      Message from: Ernestene Mention      Created: Mon Aug 18, 2013  5:35 PM       Inform patient of Pathology report,.No malignancy. Benign intraductal papilloma. Good news.            hmi ------

## 2013-09-09 ENCOUNTER — Encounter (INDEPENDENT_AMBULATORY_CARE_PROVIDER_SITE_OTHER): Payer: Self-pay | Admitting: General Surgery

## 2013-09-09 ENCOUNTER — Ambulatory Visit (INDEPENDENT_AMBULATORY_CARE_PROVIDER_SITE_OTHER): Payer: BC Managed Care – PPO | Admitting: General Surgery

## 2013-09-09 VITALS — BP 112/82 | HR 64 | Temp 97.4°F | Resp 16 | Ht 63.0 in | Wt 119.4 lb

## 2013-09-09 DIAGNOSIS — D249 Benign neoplasm of unspecified breast: Secondary | ICD-10-CM

## 2013-09-09 DIAGNOSIS — D242 Benign neoplasm of left breast: Secondary | ICD-10-CM

## 2013-09-09 NOTE — Progress Notes (Signed)
Patient ID: Rhonda Deleon, female   DOB: 08/29/1966, 47 y.o.   MRN: [id number] History: Postop visit. Underwent left partial mastectomy with needle localization on 08/15/2013. Final pathology report shows 2 intraductal papillomas. No atypia. No malignancy. She has no complaints about wound healing.  Exam: Patient looks well. Good spirits. Left breast shows transverse incision lower inner quadrant healing very nicely. No sign of any infection or hematoma or fluid.  Assessment: Intraductal papilloma left breast, recovering uneventfully following excision Family history breast cancer in a grandmother  Plan: Routine mammograms from here on out Routine breast exam by her PCP   return to see me as needed.    Angelia Mould. Derrell Lolling, M.D., [facility] General and Minimally invasive Surgery Breast and Colorectal Surgery Office:   [phone] Pager:   [phone]

## 2013-09-09 NOTE — Patient Instructions (Signed)
Your  left breast lumpectomy specimen showed intraductal papillomas. There were 2 side by side to each other. They were completely benign. There was no atypia and no malignancy. We have reviewed the pathology report today.  Your incision is healing nicely. There is no sign of any complication.  Be sure to continue to get routine mammograms and routine breast exam with your primary care physician.  Return to see Dr. Derrell Lolling as needed.

## 2013-09-10 ENCOUNTER — Encounter (INDEPENDENT_AMBULATORY_CARE_PROVIDER_SITE_OTHER): Payer: Self-pay

## 2013-11-19 ENCOUNTER — Other Ambulatory Visit: Payer: Self-pay | Admitting: Radiology

## 2013-11-25 ENCOUNTER — Ambulatory Visit (INDEPENDENT_AMBULATORY_CARE_PROVIDER_SITE_OTHER): Payer: BC Managed Care – PPO | Admitting: General Surgery

## 2013-11-25 ENCOUNTER — Encounter (INDEPENDENT_AMBULATORY_CARE_PROVIDER_SITE_OTHER): Payer: Self-pay | Admitting: General Surgery

## 2013-11-25 VITALS — BP 136/78 | HR 72 | Temp 98.6°F | Resp 12 | Ht 63.0 in | Wt 119.4 lb

## 2013-11-25 DIAGNOSIS — D241 Benign neoplasm of right breast: Secondary | ICD-10-CM | POA: Insufficient documentation

## 2013-11-25 DIAGNOSIS — D249 Benign neoplasm of unspecified breast: Secondary | ICD-10-CM

## 2013-11-25 NOTE — Patient Instructions (Signed)
You'll be scheduled for right partial mastectomy with radioactive seed localization. We will notify [facility] to assist [place] with this.  Our goals will be to remove the area laterally where the papillary lesion is and if there are any hard lumps immediately adjacent to that then we will remove those also.      Lumpectomy A lumpectomy is a form of "breast conserving" or "breast preservation" surgery. It may also be referred to as a partial mastectomy. During a lumpectomy, the portion of the breast that contains the cancerous tumor or breast mass (the lump) is removed. Some normal tissue around the lump may also be removed to make sure all the tumor has been removed. This surgery should take 40 minutes or less. LET [facility] CARE PROVIDER KNOW ABOUT:  Any allergies you have.  All medicines you are taking, including vitamins, herbs, eye drops, creams, and over-the-counter medicines.  Previous problems you or members of your family have had with the use of anesthetics.  Any blood disorders you have.  Previous surgeries you have had.  Medical conditions you have. RISKS AND COMPLICATIONS Generally, this is a safe procedure. However, as with any procedure, complications can occur. Possible complications include:  Bleeding.  Infection.  Pain.  Temporary swelling.  Change in the shape of the breast, particularly if a large portion is removed. BEFORE THE PROCEDURE  Ask your health care provider about changing or stopping your regular medicines.  Do not eat or drink anything for 7 8 hours before the surgery or as directed by your health care provider. Ask your health care provider if you can take a sip of water with any approved medicines.  On the day of surgery, your healthcare provider will use a mammogram or ultrasound to locate and mark the tumor in your breast. These markings on your breast will show where the cut (incision) will be made. PROCEDURE   An IV tube will be  put into one of your veins.  You may be given medicine to help you relax before the surgery (sedative). You will be given one of the following:  A medicine that numbs the area (local anesthesia).  A medicine that makes you go to sleep (general anesthesia).  Your health care provider will use a kind of electric scalpel that uses heat to minimize bleeding (electrocautery knife).  A curved incision (like a smile or frown) that follows the natural curve of your breast is made, to allow for minimal scarring and better healing.  The tumor will be removed with some of the surrounding tissue. This will be sent to the lab for analysis. Your health care provider may also remove your lymph nodes at this time if needed.  Sometimes, but not always, a rubber tube called a drain will be surgically inserted into your breast area or armpit to collect excess fluid that may accumulate in the space where the tumor was. This drain is connected to a plastic bulb on the outside of your body. This drain creates suction to help remove the fluid.  The incisions will be closed with stitches (sutures).  A bandage may be placed over the incisions. AFTER THE PROCEDURE  You will be taken to the recovery area.  You will be given medicine for pain.  A small rubber drain may be placed in the breast for 2 3 days to prevent a collection of blood (hematoma) from developing in the breast. You will be given instructions on caring for the drain before you  go home.  A pressure bandage (dressing) will be applied for 1 2 days to prevent bleeding. Ask your health care provider how to care for your bandage at home. Document Released: 10/23/2006 Document Revised: 05/14/2013 Document Reviewed: 02/14/2013 [facility] Patient Information 2014 [employer].

## 2013-11-25 NOTE — Progress Notes (Signed)
Patient ID: Rhonda Deleon, female   DOB: 1966-07-02, 48 y.o.   MRN: [id number]  No chief complaint on file.   HPI Rhonda Deleon is a 48 y.o. female.  This patient was referred back to me by Dr. Christene Slates at [facility]  for evaluation of a papillary lesion of the right breast.  I originally evaluated this patient on 08/07/2013. At that point in time she was evaluated for her left breast. Imaging studies showed a mass at the 9:00 position on the left. Image guided biopsy showed intraductal papilloma. She did have a family history of breast cancer a maternal grandmother. She underwent left partial mastectomy with needle localization on 08/15/2013. Final pathology showed 2 benign intraductal papillomas and she healed well.  She now thinks that she felt lumps in her right breast. She had imaging studies and then  3 separate areas were biopsied in the right breast laterally. Biopsy at 9:00 showed fibrocystic changes with adenosis. Biopsy at right breast 8:00 showed fibrocystic changes with microcalcifications. No atypia was found.    Additionally, In the right breast, reportedly at 9:00 position a third biopsy was performed and this shows apocrinecells and papillary fragments. Papillary lesion could not be ruled out. The patient states Dr.Cornella  told her that she needed this area excised and is here to discuss this with me. She has not had any skin changes. Denies nipple discharge. Denies increase in pain.  Imaging studies and biopsies are as follows. Ultrasound-guided biopsy of a 1.4 cm solid mass in the right breast at the 8:00 position was performed with findings as described above. Second biopsy is ultrasound-guided biopsy of a 1.2 cm partially solid mass in the right breast at 9:00 middle depth with findings as described above. A third biopsy was aspiration of a 7 mm cyst in the right breast, anterior depth pathology indicated benign papillary fragments. This is the area that we are going to  excise.  Comorbidities include psoriatic arthritis on Remicade. Migraine headaches. Asthma.    HPI  Past Medical History  Diagnosis Date  . Headache(784.0)     migraines  . Seizures     as child  . 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 Neuromuscular disorder     movement disorder  . Asthma   . Arthritis     psoriatic    Past Surgical History  Procedure Laterality Date  . Cesarean section  1991  . Wisdom tooth extraction  as child  . Robotic assisted lap vaginal hysterectomy  12/05/11  . Abdominal hysterectomy      3/13-vag-LOA  . Breast lumpectomy with needle localization Left 08/15/2013    Procedure: BREAST LUMPECTOMY WITH NEEDLE LOCALIZATION;  Surgeon: Adin Hector, MD;  Location: [place];  Service: General;  Laterality: Left;    Family History  Problem Relation Age of Onset  . Hypertension Mother   . Cancer Maternal Grandmother     breast    Social History History  Substance Use Topics  . Smoking status: Former Smoker    Quit date: 09/25/1997  . Smokeless tobacco: Never Used  . Alcohol Use: 1.0 oz/week    2 drink(s) per week     Comment: social    No Known Allergies  Current Outpatient Prescriptions  Medication Sig Dispense Refill  . Betamethasone Valerate 0.12 % foam       . FLOVENT HFA 110 MCG/ACT inhaler       .  fluticasone (FLONASE) 50 MCG/ACT nasal spray       . polyethylene glycol (MIRALAX / GLYCOLAX) packet Take 17 g by mouth daily.      . traMADol (ULTRAM) 50 MG tablet Take 1 tablet (50 mg total) by mouth every 6 (six) hours as needed for pain.  30 tablet  0  . VENTOLIN HFA 108 (90 BASE) MCG/ACT inhaler       . chlorpheniramine-HYDROcodone (TUSSIONEX) 10-8 MG/5ML LQCR       . HYDROcodone-acetaminophen (NORCO/VICODIN) 5-325 MG per tablet Take 1-2 tablets by mouth every 4 (four) hours as needed.  30 tablet  0  . inFLIXimab (REMICADE)  100 MG injection Inject into the vein.      . methocarbamol (ROBAXIN) 750 MG tablet TAKE ONE TABLET BY MOUTH ONCE A DAY  30 tablet  3   No current facility-administered medications for this visit.    Review of Systems Review of Systems  Musculoskeletal: Positive for arthralgias and myalgias.  Neurological: Positive for headaches.    Blood pressure 136/78, pulse 72, temperature 98.6 F (37 C), temperature source Oral, resp. rate 12, height 5\' 3"  (1.6 m), weight 119 lb 6.4 oz (54.159 kg), last menstrual period 11/18/2011.  Physical Exam Physical Exam  Constitutional: She is oriented to person, place, and time. She appears well-developed and well-nourished. No distress.  HENT:  Head: Normocephalic and atraumatic.  Nose: Nose normal.  Mouth/Throat: No oropharyngeal exudate.  Eyes: Conjunctivae and EOM are normal. Pupils are equal, round, and reactive to light. Left eye exhibits no discharge. No scleral icterus.  Neck: Neck supple. No JVD present. No tracheal deviation present. No thyromegaly present.  Cardiovascular: Normal rate, regular rhythm, normal heart sounds and intact distal pulses.   No murmur heard. Pulmonary/Chest: Effort normal and breath sounds normal. No respiratory distress. She has no wheezes. She has no rales. She exhibits no tenderness.    Breasts are medium size. They are very lumpy diffusely. No dominant mass. Recent biopsy sites lateral right breast looked okay with no bleeding or hematoma. Scar left breast well healed. No axillary adenopathy.  Abdominal: Soft. Bowel sounds are normal. She exhibits no distension and no mass. There is no tenderness. There is no rebound and no guarding.  Musculoskeletal: She exhibits no edema and no tenderness.  Lymphadenopathy:    She has no cervical adenopathy.  Neurological: She is alert and oriented to person, place, and time. She exhibits normal muscle tone. Coordination normal.  Skin: Skin is warm. No rash noted. She is not  diaphoretic. No erythema. No pallor.  Psychiatric: She has a normal mood and affect. Her behavior is normal. Judgment and thought content normal.    Data Reviewed Recent imaging studies, biopsy reports, and pathology reports. Mild records  Assessment    Possible papillary lesion right breast, 9 oclock position. Excisional biopsy indicated. Suspect this will be benign  Significant fibrocystic disease with lumpy breasts, difficult exam  Status post left partial mastectomy with needle localization 08/15/2013 revealing 2 benign intraductal papillomas. Uneventful healing   Family history breast cancer in maternal grandmother  Psoriatic arthritis on Remicade infusions periodically  Asthma  Migraine headaches    Plan    She very much would like to the area of the papillary lesion to be excised, and so we're going to schedule her for a right partial mastectomy with radioactive seed localization.  She also asked if I would remove all the painful lumps laterally, and I told her that would probably cause more  problems that it was worth. If there is a very hard lump that is immediately adjacent to the radioactive seeds and I told her I would remove that but that the excision will be conservative. She accepts this. She worries about whether this is going to be an ongoing and repetitive problem. We talked about the pros and cons of MRI and the trouble getting that reimbursed by insurance companies. Were not limited for that at this time.   I discussed the indications, details, techniques, and numerous risk partial mastectomy with her. She is familiar with this from her previous biopsy. She has has a risk of bleeding, infection, reoperation for complication trochanter, cosmetic deformity, and other unforeseen problems. She understands all these issues all her questions were answered. She is in full agreement with this plan.        Edsel Petrin. Dalbert Batman, M.D., [facility] General and Minimally invasive Surgery Breast and Colorectal Surgery Office:   [phone] Pager:   [phone]  11/25/2013, 2:05 PM

## 2013-11-26 ENCOUNTER — Encounter (HOSPITAL_BASED_OUTPATIENT_CLINIC_OR_DEPARTMENT_OTHER): Payer: Self-pay | Admitting: *Deleted

## 2013-11-26 NOTE — Progress Notes (Signed)
Pt for rt lumpectomy-had lt lump 11/14 here

## 2013-11-27 NOTE — H&P (Signed)
Rhonda Deleon   MRN:  [id number]   Description: 48 year old female  Provider: Adin Hector, MD  Department: [facility]                Diagnoses      Papilloma of right breast    -  Primary      217               Current Vitals - Last Recorded      BP Pulse Temp(Src) Resp Ht Wt      136/78 72 98.6 F (37 C) (Oral) 12 5\' 3"  (1.6 m) 119 lb 6.4 oz (54.159 kg)            BMI 21.16 kg/m2 11/18/2011                           History and Physical    Adin Hector, MD    Status: Signed            Patient ID: Rhonda Deleon, female   DOB: 1966/03/07, 48 y.o.   MRN: [id number]           HPI [name] is a 48 y.o. female.  This patient was referred back to me by Dr. Christene Slates at [facility]  for evaluation of a papillary lesion of the right breast.   I originally evaluated this patient on 08/07/2013. At that point in time she was evaluated for her left breast. Imaging studies showed a mass at the 9:00 position on the left. Image guided biopsy showed intraductal papilloma. She did have a family history of breast cancer a maternal grandmother. She underwent left partial mastectomy with needle localization on 08/15/2013. Final pathology showed 2 benign intraductal papillomas and she healed well.   She now thinks that she felt lumps in her right breast. She had imaging studies and then  3 separate areas were biopsied in the right breast laterally. Biopsy at 9:00 showed fibrocystic changes with adenosis. Biopsy at right breast 8:00 showed fibrocystic changes with microcalcifications. No atypia was found.    Additionally, In the right breast, reportedly at 9:00 position a third biopsy was performed and this shows apocrinecells and papillary fragments. Papillary lesion could not be ruled out. The patient states Dr.Cornella  told her that she needed this area excised and is here to discuss this with me. She has not had any skin changes. Denies nipple discharge.  Denies increase in pain.   Imaging studies and biopsies are as follows. Ultrasound-guided biopsy of a 1.4 cm solid mass in the right breast at the 8:00 position was performed with findings as described above. Second biopsy is ultrasound-guided biopsy of a 1.2 cm partially solid mass in the right breast at 9:00 middle depth with findings as described above. A third biopsy was aspiration of a 7 mm cyst in the right breast, anterior depth pathology indicated benign papillary fragments. This is the area that we are going to excise.   Comorbidities include psoriatic arthritis on Remicade. Migraine headaches. Asthma.         Past Medical History   Diagnosis  Date   .  Headache(784.0)         migraines   .  Seizures         as child   .  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  Neuromuscular disorder         movement disorder   .  Asthma     .  Arthritis         psoriatic         Past Surgical History   Procedure  Laterality  Date   .  Cesarean section    1991   .  Wisdom tooth extraction    as child   .  Robotic assisted lap vaginal hysterectomy    12/05/11   .  Abdominal hysterectomy           3/13-vag-LOA   .  Breast lumpectomy with needle localization  Left  08/15/2013       Procedure: BREAST LUMPECTOMY WITH NEEDLE LOCALIZATION;  Surgeon: Adin Hector, MD;  Location: [place];  Service: General;  Laterality: Left;         Family History   Problem  Relation  Age of Onset   .  Hypertension  Mother     .  Cancer  Maternal Grandmother         breast        Social History History   Substance Use Topics   .  Smoking status:  Former Smoker       Quit date:  09/25/1997   .  Smokeless tobacco:  Never Used   .  Alcohol Use:  1.0 oz/week       2 drink(s) per week         Comment: social        No Known Allergies    Current Outpatient Prescriptions    Medication  Sig  Dispense  Refill   .  Betamethasone Valerate 0.12 % foam           .  FLOVENT HFA 110 MCG/ACT inhaler           .  fluticasone (FLONASE) 50 MCG/ACT nasal spray           .  polyethylene glycol (MIRALAX / GLYCOLAX) packet  Take 17 g by mouth daily.         .  traMADol (ULTRAM) 50 MG tablet  Take 1 tablet (50 mg total) by mouth every 6 (six) hours as needed for pain.   30 tablet   0   .  VENTOLIN HFA 108 (90 BASE) MCG/ACT inhaler           .  chlorpheniramine-HYDROcodone (TUSSIONEX) 10-8 MG/5ML LQCR           .  HYDROcodone-acetaminophen (NORCO/VICODIN) 5-325 MG per tablet  Take 1-2 tablets by mouth every 4 (four) hours as needed.   30 tablet   0   .  inFLIXimab (REMICADE) 100 MG injection  Inject into the vein.         .  methocarbamol (ROBAXIN) 750 MG tablet  TAKE ONE TABLET BY MOUTH ONCE A DAY   30 tablet   3       Review of Systems  Musculoskeletal: Positive for arthralgias and myalgias.  Neurological: Positive for headaches.      Blood pressure 136/78, pulse 72, temperature 98.6 F (37 C), temperature source Oral, resp. rate 12, height 5\' 3"  (1.6 m), weight 119 lb 6.4 oz (54.159 kg), last menstrual period 11/18/2011.   Physical Exam  Constitutional: She is oriented to person, place, and time. She appears well-developed and well-nourished. No distress.  HENT:   Head: Normocephalic and atraumatic.  Nose: Nose normal.   Mouth/Throat: No oropharyngeal exudate.  Eyes: Conjunctivae and EOM are normal. Pupils are equal, round, and reactive to light. Left eye exhibits no discharge. No scleral icterus.  Neck: Neck supple. No JVD present. No tracheal deviation present. No thyromegaly present.  Cardiovascular: Normal rate, regular rhythm, normal heart sounds and intact distal pulses.    No murmur heard. Pulmonary/Chest: Effort normal and breath sounds normal. No respiratory distress. She has no wheezes. She has no rales. She exhibits no tenderness.    Breasts are  medium size. They are very lumpy diffusely. No dominant mass. Recent biopsy sites lateral right breast looked okay with no bleeding or hematoma. Scar left breast well healed. No axillary adenopathy.  Abdominal: Soft. Bowel sounds are normal. She exhibits no distension and no mass. There is no tenderness. There is no rebound and no guarding.  Musculoskeletal: She exhibits no edema and no tenderness.  Lymphadenopathy:    She has no cervical adenopathy.  Neurological: She is alert and oriented to person, place, and time. She exhibits normal muscle tone. Coordination normal.  Skin: Skin is warm. No rash noted. She is not diaphoretic. No erythema. No pallor.  Psychiatric: She has a normal mood and affect. Her behavior is normal. Judgment and thought content normal.      Data Reviewed Recent imaging studies, biopsy reports, and pathology reports. Mild records   Assessment    Possible papillary lesion right breast, 9 oclock position. Excisional biopsy indicated. Suspect this will be benign   Significant fibrocystic disease with lumpy breasts, difficult exam   Status post left partial mastectomy with needle localization 08/15/2013 revealing 2 benign intraductal papillomas. Uneventful healing    Family history breast cancer in maternal grandmother   Psoriatic arthritis on Remicade infusions periodically   Asthma   Migraine headaches     Plan    She very much would like to the area of the papillary lesion to be excised, and so we're going to schedule her for a right partial mastectomy with radioactive seed localization.   She also asked if I would remove all the painful lumps laterally, and I told her that would probably cause more problems that it was worth. If there is a very hard lump that is immediately adjacent to the radioactive seeds and I told her I would remove that but that the excision will be conservative. She accepts this. She worries about whether this is going to be  an ongoing and repetitive problem. We talked about the pros and cons of MRI and the trouble getting that reimbursed by insurance companies. Were not limited for that at this time.     I discussed the indications, details, techniques, and numerous risk partial mastectomy with her. She is familiar with this from her previous biopsy. She has has a risk of bleeding, infection, reoperation for complication trochanter, cosmetic deformity, and other unforeseen problems. She understands all these issues all her questions were answered. She is in full agreement with this plan.           Edsel Petrin. Dalbert Batman, M.D., [facility] General and Minimally invasive Surgery Breast and Colorectal Surgery Office:   [phone] Pager:   [phone]

## 2013-11-28 ENCOUNTER — Encounter (INDEPENDENT_AMBULATORY_CARE_PROVIDER_SITE_OTHER): Payer: Self-pay

## 2013-11-28 ENCOUNTER — Encounter (INDEPENDENT_AMBULATORY_CARE_PROVIDER_SITE_OTHER): Payer: Self-pay | Admitting: Internal Medicine

## 2013-11-28 ENCOUNTER — Telehealth (INDEPENDENT_AMBULATORY_CARE_PROVIDER_SITE_OTHER): Payer: Self-pay | Admitting: *Deleted

## 2013-11-28 NOTE — Telephone Encounter (Signed)
Called and gave PO appt information to patient's husband.  PO appt 12/09/13 @ 345p to arrive at 330p.  Husband wrote information down to give to patient.

## 2013-12-02 ENCOUNTER — Encounter (HOSPITAL_BASED_OUTPATIENT_CLINIC_OR_DEPARTMENT_OTHER): Payer: Self-pay

## 2013-12-02 ENCOUNTER — Ambulatory Visit (HOSPITAL_BASED_OUTPATIENT_CLINIC_OR_DEPARTMENT_OTHER)
Admission: RE | Admit: 2013-12-02 | Discharge: 2013-12-02 | Disposition: A | Discharge status: Home / Self Care | Payer: BC Managed Care – PPO | Source: Ambulatory Visit | Attending: General Surgery | Admitting: General Surgery

## 2013-12-02 ENCOUNTER — Encounter (HOSPITAL_BASED_OUTPATIENT_CLINIC_OR_DEPARTMENT_OTHER): Payer: BC Managed Care – PPO | Admitting: Anesthesiology

## 2013-12-02 ENCOUNTER — Encounter (HOSPITAL_BASED_OUTPATIENT_CLINIC_OR_DEPARTMENT_OTHER)
Admission: RE | Disposition: A | Discharge status: Home / Self Care | Payer: BC Managed Care – PPO | Source: Ambulatory Visit | Attending: General Surgery

## 2013-12-02 ENCOUNTER — Ambulatory Visit (HOSPITAL_BASED_OUTPATIENT_CLINIC_OR_DEPARTMENT_OTHER): Payer: BC Managed Care – PPO | Admitting: Anesthesiology

## 2013-12-02 DIAGNOSIS — L4050 Arthropathic psoriasis, unspecified: Secondary | ICD-10-CM | POA: Insufficient documentation

## 2013-12-02 DIAGNOSIS — D249 Benign neoplasm of unspecified breast: Secondary | ICD-10-CM

## 2013-12-02 DIAGNOSIS — D241 Benign neoplasm of right breast: Secondary | ICD-10-CM

## 2013-12-02 DIAGNOSIS — J45909 Unspecified asthma, uncomplicated: Secondary | ICD-10-CM | POA: Insufficient documentation

## 2013-12-02 HISTORY — PX: BREAST LUMPECTOMY WITH RADIOACTIVE SEED LOCALIZATION: SHX6424

## 2013-12-02 LAB — POCT HEMOGLOBIN-HEMACUE: HEMOGLOBIN: 12.9 g/dL (ref 12.0–15.0)

## 2013-12-02 SURGERY — BREAST LUMPECTOMY WITH RADIOACTIVE SEED LOCALIZATION
Anesthesia: General | Site: Breast | Laterality: Right

## 2013-12-02 MED ORDER — PROPOFOL 10 MG/ML IV BOLUS
INTRAVENOUS | Status: DC | PRN
  Administered 2013-12-02: 200 mg via INTRAVENOUS

## 2013-12-02 MED ORDER — CEFAZOLIN SODIUM-DEXTROSE 2-3 GM-% IV SOLR
2.0000 g | INTRAVENOUS | Status: AC
  Administered 2013-12-02: 2 g via INTRAVENOUS

## 2013-12-02 MED ORDER — BUPIVACAINE-EPINEPHRINE 0.5-1:200000 % IJ SOLN
INTRAMUSCULAR | Status: DC | PRN
  Administered 2013-12-02: 7 mL via INTRAPLEURAL

## 2013-12-02 MED ORDER — BUPIVACAINE-EPINEPHRINE PF 0.5-1:200000 % IJ SOLN
INTRAMUSCULAR | Status: AC
  Filled 2013-12-02: qty 30

## 2013-12-02 MED ORDER — OXYCODONE HCL 5 MG/5ML PO SOLN: 5.0000 mg | Freq: Once | ORAL | Status: DC | PRN

## 2013-12-02 MED ORDER — LIDOCAINE HCL (CARDIAC) 20 MG/ML IV SOLN
INTRAVENOUS | Status: DC | PRN
  Administered 2013-12-02: 5 mg via INTRAVENOUS

## 2013-12-02 MED ORDER — HYDROCODONE-ACETAMINOPHEN 5-325 MG PO TABS: 1.0000 | ORAL_TABLET | ORAL | Status: DC | PRN

## 2013-12-02 MED ORDER — FENTANYL CITRATE 0.05 MG/ML IJ SOLN
INTRAMUSCULAR | Status: DC | PRN
  Administered 2013-12-02: 100 ug via INTRAVENOUS
  Administered 2013-12-02: 50 ug via INTRAVENOUS
  Administered 2013-12-02 (×2): 25 ug via INTRAVENOUS

## 2013-12-02 MED ORDER — DEXAMETHASONE SODIUM PHOSPHATE 4 MG/ML IJ SOLN
INTRAMUSCULAR | Status: DC | PRN
  Administered 2013-12-02: 10 mg via INTRAVENOUS

## 2013-12-02 MED ORDER — CEFAZOLIN SODIUM-DEXTROSE 2-3 GM-% IV SOLR
INTRAVENOUS | Status: AC
  Filled 2013-12-02: qty 50

## 2013-12-02 MED ORDER — MIDAZOLAM HCL 5 MG/5ML IJ SOLN
INTRAMUSCULAR | Status: DC | PRN
  Administered 2013-12-02: 2 mg via INTRAVENOUS

## 2013-12-02 MED ORDER — ONDANSETRON HCL 4 MG/2ML IJ SOLN: 4.0000 mg | Freq: Once | INTRAMUSCULAR | Status: DC | PRN

## 2013-12-02 MED ORDER — FENTANYL CITRATE 0.05 MG/ML IJ SOLN
INTRAMUSCULAR | Status: AC
  Filled 2013-12-02: qty 6

## 2013-12-02 MED ORDER — LACTATED RINGERS IV SOLN
INTRAVENOUS | Status: DC
  Administered 2013-12-02 (×2): via INTRAVENOUS

## 2013-12-02 MED ORDER — MIDAZOLAM HCL 2 MG/2ML IJ SOLN
INTRAMUSCULAR | Status: AC
  Filled 2013-12-02: qty 2

## 2013-12-02 MED ORDER — FENTANYL CITRATE 0.05 MG/ML IJ SOLN: 50.0000 ug | INTRAMUSCULAR | Status: DC | PRN

## 2013-12-02 MED ORDER — CHLORHEXIDINE GLUCONATE 4 % EX LIQD: 1.0000 "application " | Freq: Once | CUTANEOUS | Status: DC

## 2013-12-02 MED ORDER — OXYCODONE HCL 5 MG PO TABS: 5.0000 mg | ORAL_TABLET | Freq: Once | ORAL | Status: DC | PRN

## 2013-12-02 MED ORDER — HYDROMORPHONE HCL PF 1 MG/ML IJ SOLN
0.2500 mg | INTRAMUSCULAR | Status: DC | PRN
  Administered 2013-12-02: 0.5 mg via INTRAVENOUS

## 2013-12-02 MED ORDER — BUPIVACAINE-EPINEPHRINE PF 0.25-1:200000 % IJ SOLN
INTRAMUSCULAR | Status: AC
  Filled 2013-12-02: qty 30

## 2013-12-02 MED ORDER — HYDROMORPHONE HCL PF 1 MG/ML IJ SOLN
INTRAMUSCULAR | Status: AC
  Filled 2013-12-02: qty 1

## 2013-12-02 MED ORDER — MIDAZOLAM HCL 2 MG/2ML IJ SOLN
1.0000 mg | INTRAMUSCULAR | Status: DC | PRN
Start: 2013-12-02 — End: 2013-12-02

## 2013-12-02 MED FILL — Bupivacaine Inj 0.5% w/ Epinephrine 1:200000 (PF): INTRAMUSCULAR | Qty: 30 | Status: AC

## 2013-12-02 SURGICAL SUPPLY — 66 items
ADH SKN CLS APL DERMABOND .7 (GAUZE/BANDAGES/DRESSINGS) ×1
APL SKNCLS STERI-STRIP NONHPOA (GAUZE/BANDAGES/DRESSINGS)
APPLIER CLIP 9.375 MED OPEN (MISCELLANEOUS) ×2
APR CLP MED 9.3 20 MLT OPN (MISCELLANEOUS) ×1
BENZOIN TINCTURE PRP APPL 2/3 (GAUZE/BANDAGES/DRESSINGS) IMPLANT
BINDER BREAST LRG (GAUZE/BANDAGES/DRESSINGS) IMPLANT
BINDER BREAST MEDIUM (GAUZE/BANDAGES/DRESSINGS) ×1 IMPLANT
BINDER BREAST XLRG (GAUZE/BANDAGES/DRESSINGS) IMPLANT
BINDER BREAST XXLRG (GAUZE/BANDAGES/DRESSINGS) IMPLANT
BLADE HEX COATED 2.75 (ELECTRODE) ×2 IMPLANT
BLADE SURG 10 STRL SS (BLADE) IMPLANT
BLADE SURG 15 STRL LF DISP TIS (BLADE) ×1 IMPLANT
BLADE SURG 15 STRL SS (BLADE) ×2
CANISTER SUC SOCK COL 7IN (MISCELLANEOUS) ×2 IMPLANT
CANISTER SUCT 1200ML W/VALVE (MISCELLANEOUS) ×2 IMPLANT
CHLORAPREP W/TINT 26ML (MISCELLANEOUS) ×2 IMPLANT
CLIP APPLIE 9.375 MED OPEN (MISCELLANEOUS) ×1 IMPLANT
COVER MAYO STAND STRL (DRAPES) ×2 IMPLANT
COVER PROBE W GEL 5X96 (DRAPES) ×2 IMPLANT
COVER TABLE BACK 60X90 (DRAPES) ×2 IMPLANT
DECANTER SPIKE VIAL GLASS SM (MISCELLANEOUS) IMPLANT
DERMABOND ADVANCED (GAUZE/BANDAGES/DRESSINGS) ×1
DERMABOND ADVANCED .7 DNX12 (GAUZE/BANDAGES/DRESSINGS) ×1 IMPLANT
DEVICE DUBIN W/COMP PLATE 8390 (MISCELLANEOUS) ×2 IMPLANT
DRAIN CHANNEL 19F RND (DRAIN) IMPLANT
DRAPE LAPAROSCOPIC ABDOMINAL (DRAPES) ×2 IMPLANT
ELECT REM PT RETURN 9FT ADLT (ELECTROSURGICAL) ×2
ELECTRODE REM PT RTRN 9FT ADLT (ELECTROSURGICAL) ×1 IMPLANT
EVACUATOR SILICONE 100CC (DRAIN) IMPLANT
GLOVE BIOGEL PI IND STRL 7.0 (GLOVE) IMPLANT
GLOVE BIOGEL PI INDICATOR 7.0 (GLOVE) ×1
GLOVE ECLIPSE 6.5 STRL STRAW (GLOVE) ×1 IMPLANT
GLOVE EUDERMIC 7 POWDERFREE (GLOVE) ×2 IMPLANT
GLOVE EXAM NITRILE MD LF STRL (GLOVE) ×1 IMPLANT
GOWN STRL REUS W/ TWL LRG LVL3 (GOWN DISPOSABLE) ×1 IMPLANT
GOWN STRL REUS W/ TWL XL LVL3 (GOWN DISPOSABLE) ×1 IMPLANT
GOWN STRL REUS W/TWL LRG LVL3 (GOWN DISPOSABLE) ×2
GOWN STRL REUS W/TWL XL LVL3 (GOWN DISPOSABLE) ×2
KIT MARKER MARGIN INK (KITS) ×2 IMPLANT
NDL HYPO 25X1 1.5 SAFETY (NEEDLE) IMPLANT
NEEDLE HYPO 25X1 1.5 SAFETY (NEEDLE) IMPLANT
NS IRRIG 1000ML POUR BTL (IV SOLUTION) ×2 IMPLANT
PACK BASIN DAY SURGERY FS (CUSTOM PROCEDURE TRAY) ×2 IMPLANT
PAD ABD 8X10 STRL (GAUZE/BANDAGES/DRESSINGS) IMPLANT
PENCIL BUTTON HOLSTER BLD 10FT (ELECTRODE) ×2 IMPLANT
PIN SAFETY STERILE (MISCELLANEOUS) ×2 IMPLANT
SHEET MEDIUM DRAPE 40X70 STRL (DRAPES) IMPLANT
SLEEVE SCD COMPRESS KNEE MED (MISCELLANEOUS) ×2 IMPLANT
SPONGE GAUZE 4X4 12PLY STER LF (GAUZE/BANDAGES/DRESSINGS) IMPLANT
SPONGE LAP 18X18 X RAY DECT (DISPOSABLE) IMPLANT
SPONGE LAP 4X18 X RAY DECT (DISPOSABLE) ×2 IMPLANT
STAPLER VISISTAT 35W (STAPLE) ×2 IMPLANT
STRIP CLOSURE SKIN 1/2X4 (GAUZE/BANDAGES/DRESSINGS) IMPLANT
SUT ETHILON 3 0 FSL (SUTURE) IMPLANT
SUT MNCRL AB 4-0 PS2 18 (SUTURE) ×2 IMPLANT
SUT SILK 2 0 SH (SUTURE) ×2 IMPLANT
SUT VIC AB 2-0 CT1 27 (SUTURE)
SUT VIC AB 2-0 CT1 TAPERPNT 27 (SUTURE) IMPLANT
SUT VIC AB 3-0 SH 27 (SUTURE)
SUT VIC AB 3-0 SH 27X BRD (SUTURE) IMPLANT
SUT VICRYL 3-0 CR8 SH (SUTURE) ×2 IMPLANT
SYR CONTROL 10ML LL (SYRINGE) IMPLANT
TOWEL OR 17X24 6PK STRL BLUE (TOWEL DISPOSABLE) ×2 IMPLANT
TOWEL OR NON WOVEN STRL DISP B (DISPOSABLE) ×2 IMPLANT
TUBE CONNECTING 20X1/4 (TUBING) ×2 IMPLANT
YANKAUER SUCT BULB TIP NO VENT (SUCTIONS) ×2 IMPLANT

## 2013-12-02 NOTE — Op Note (Signed)
Patient Name:           Rhonda Deleon   Date of Surgery:        12/02/2013  Pre op Diagnosis:      Possible papillary lesion right breast, 9:00 position  Post op Diagnosis:    Same  Procedure:                 Right partial mastectomy with radioactive seed localization and a wire localization  Surgeon:                     Edsel Petrin. Dalbert Batman, M.D., FACS  Assistant:                      None  Operative Indications:   Rhonda Deleon is a 48 y.o. female. This patient was referred back to me by Dr. Christene Slates at [facility] for evaluation of a papillary lesion of the right breast.  4 months ago I performed a left lumpectomy on this patient for what turned it to be a benign intraductal papilloma..  She now thinks that she felt lumps in her right breast. She had imaging studies and then 3 separate areas were biopsied in the right breast laterally. Biopsy at 9:00 showed fibrocystic changes with adenosis. Biopsy at right breast 8:00 showed fibrocystic changes with microcalcifications. No atypia was found. Additionally, In the right breast, reportedly at 9:00 position a third biopsy was performed and this shows apocrinecells and papillary fragments. Papillary lesion could not be ruled out..  Dr. Windle Guard advised excision, and I agree.  She has not had any skin changes. Denies nipple discharge. Denies increase in pain.  Imaging studies and biopsies are as follows. Ultrasound-guided biopsy of a 1.4 cm solid mass in the right breast at the 8:00 position was performed with findings as described above. Second biopsy is ultrasound-guided biopsy of a 1.2 cm partially solid mass in the right breast at 9:00 middle depth with findings as described above. A third biopsy was aspiration of a 7 mm cyst in the right breast, anterior depth pathology indicated benign papillary fragments. This is the area that we are going to excise.  Yesterday, a radioactive seed was placed at the corneal. On review of the films this was placed  at the 10:00 position. The area of interest it requires excision as in the 9:00 position. We agree that he would place a wire today and I would excise both areas. Marland Kitchen   Operative Findings:       The localizing wire entered the right breast from the inferior lateral position and was directed superiorly through the area of interest. The radioactive seed was actually a little bit superior and anterior to this. The specimen mammogram showed the original marker clip, the radioactive seed, and the wire. All of the radioactivity was present within the lumpectomy specimen. At the completion of the case there was no radioactivity within the breast.  Procedure in Detail:          As described above, a radioactive seed was placed in the right breast laterally yesterday. A localizing wire was placed this morning. I reviewed the films and found that the 2 areas were about 2 cm apart. The patient was brought to the operating room and underwent general anesthesia with LMA device. Intravenous antibiotics were given. The right breast was prepped and draped in a sterile fashion. Surgical time out was performed. 0.5% Marcaine with epinephrine was used as local  infiltration anesthetic. I made a curvilinear incision laterally in the right breast, several centimeters lateral to the areolar margin. This was placed very near the insertion site of the wire and  directly over the hottest area of radioactivity as noted by the neoprobe. Dissection was carried down into the breast tissue. I dissected around the wire and around the radioactive seeds using  the neoprobe. The breast tissues were extremely dense and leathery. . I removed the specimen and marked it with silk sutures and the 6 color ink kit  to orient the pathologist. Specimen mammogram showed the wire and the marker clip and the seed in the specimen. The specimen was marked and then sent to the lab. The wound was irrigated with saline. Hemostasis was excellent. The breast  tissues were closed with 3-0 Vicryl sutures and the skin closed with a running subcuticular suture of 4-0 Monocryl and Dermabond and a breast binder. The patient was taken to  recovery room in stable condition. EBL 10 cc. Counts correct. Complications none.     Edsel Petrin. Dalbert Batman, M.D., FACS General and Minimally Invasive Surgery Breast and Colorectal Surgery  12/02/2013 12:19 PM

## 2013-12-02 NOTE — Anesthesia Postprocedure Evaluation (Signed)
  Anesthesia Post-op Note  Patient: Rhonda Deleon  Procedure(s) Performed: Procedure(s): RIGHT BREAST PARTIAL MASTECOMY WITH RADIOACTIVE SEED LOCALIZATION (Right)  Patient Location: PACU  Anesthesia Type:General and GA combined with regional for post-op pain  Level of Consciousness: awake, alert  and oriented  Airway and Oxygen Therapy: Patient Spontanous Breathing  Post-op Pain: mild  Post-op Assessment: Post-op Vital signs reviewed  Post-op Vital Signs: Reviewed  Complications: No apparent anesthesia complications

## 2013-12-02 NOTE — Anesthesia Preprocedure Evaluation (Signed)
Anesthesia Evaluation  Patient identified by MRN, date of birth, ID band Patient awake    Reviewed: Allergy & Precautions, H&P , NPO status , Patient's Chart, lab work & pertinent test results  Airway Mallampati: I TM Distance: >3 FB Neck ROM: Full    Dental  (+) Teeth Intact, Dental Advisory Given   Pulmonary asthma , former smoker,  breath sounds clear to auscultation        Cardiovascular Rhythm:Regular Rate:Normal     Neuro/Psych Seizures -, Well Controlled,     GI/Hepatic   Endo/Other    Renal/GU      Musculoskeletal   Abdominal   Peds  Hematology   Anesthesia Other Findings   Reproductive/Obstetrics                           Anesthesia Physical Anesthesia Plan  ASA: II  Anesthesia Plan: General   Post-op Pain Management:    Induction: Intravenous  Airway Management Planned: LMA  Additional Equipment:   Intra-op Plan:   Post-operative Plan: Extubation in OR  Informed Consent: I have reviewed the patients History and Physical, chart, labs and discussed the procedure including the risks, benefits and alternatives for the proposed anesthesia with the patient or authorized representative who has indicated his/her understanding and acceptance.   Dental advisory given  Plan Discussed with: CRNA, Anesthesiologist and Surgeon  Anesthesia Plan Comments:         Anesthesia Quick Evaluation

## 2013-12-02 NOTE — Anesthesia Procedure Notes (Signed)
Procedure Name: LMA Insertion Date/Time: 12/02/2013 11:22 AM Performed by: Melynda Ripple D Pre-anesthesia Checklist: Patient identified, Emergency Drugs available, Suction available and Patient being monitored Patient Re-evaluated:Patient Re-evaluated prior to inductionOxygen Delivery Method: Circle System Utilized Preoxygenation: Pre-oxygenation with 100% oxygen Intubation Type: IV induction Ventilation: Mask ventilation without difficulty LMA: LMA inserted LMA Size: 4.0 Number of attempts: 1 Airway Equipment and Method: bite block Placement Confirmation: positive ETCO2 Tube secured with: Tape Dental Injury: Teeth and Oropharynx as per pre-operative assessment

## 2013-12-02 NOTE — Transfer of Care (Signed)
Immediate Anesthesia Transfer of Care Note  Patient: Rhonda Deleon  Procedure(s) Performed: Procedure(s): RIGHT BREAST PARTIAL MASTECOMY WITH RADIOACTIVE SEED LOCALIZATION (Right)  Patient Location: PACU  Anesthesia Type:General  Level of Consciousness: awake, alert  and oriented  Airway & Oxygen Therapy: Patient Spontanous Breathing and Patient connected to face mask oxygen  Post-op Assessment: Report given to PACU RN and Post -op Vital signs reviewed and stable  Post vital signs: Reviewed and stable  Complications: No apparent anesthesia complications

## 2013-12-02 NOTE — Discharge Instructions (Signed)
[facility] °Office Phone Number [phone] ° °BREAST BIOPSY/ PARTIAL MASTECTOMY: POST OP INSTRUCTIONS ° °Always review your discharge instruction sheet given to you by the facility where your surgery was performed. ° °IF YOU HAVE DISABILITY OR FAMILY LEAVE FORMS, YOU MUST BRING THEM TO THE OFFICE FOR PROCESSING.  DO NOT GIVE THEM TO YOUR DOCTOR. ° °1. A prescription for pain medication may be given to you upon discharge.  Take your pain medication as prescribed, if needed.  If narcotic pain medicine is not needed, then you may take acetaminophen (Tylenol) or ibuprofen (Advil) as needed. °2. Take your usually prescribed medications unless otherwise directed °3. If you need a refill on your pain medication, please contact your pharmacy.  They will contact our office to request authorization.  Prescriptions will not be filled after 5pm or on week-ends. °4. You should eat very light the first 24 hours after surgery, such as soup, crackers, pudding, etc.  Resume your normal diet the day after surgery. °5. Most patients will experience some swelling and bruising in the breast.  Ice packs and a good support bra will help.  Swelling and bruising can take several days to resolve.  °6. It is common to experience some constipation if taking pain medication after surgery.  Increasing fluid intake and taking a stool softener will usually help or prevent this problem from occurring.  A mild laxative (Milk of Magnesia or Miralax) should be taken according to package directions if there are no bowel movements after 48 hours. °7. Unless discharge instructions indicate otherwise, you may remove your bandages 24-48 hours after surgery, and you may shower at that time.  You may have steri-strips (small skin tapes) in place directly over the incision.  These strips should be left on the skin for 7-10 days.  If your surgeon used skin glue on the incision, you may shower in 24 hours.  The glue will flake off over the  next 2-3 weeks.  Any sutures or staples will be removed at the office during your follow-up visit. °8. ACTIVITIES:  You may resume regular daily activities (gradually increasing) beginning the next day.  Wearing a good support bra or sports bra minimizes pain and swelling.  You may have sexual intercourse when it is comfortable. °a. You may drive when you no longer are taking prescription pain medication, you can comfortably wear a seatbelt, and you can safely maneuver your car and apply brakes. °b. RETURN TO WORK:  ______________________________________________________________________________________ °9. You should see your doctor in the office for a follow-up appointment approximately two weeks after your surgery.  Your doctor’s nurse will typically make your follow-up appointment when she calls you with your pathology report.  Expect your pathology report 2-3 business days after your surgery.  You may call to check if you do not hear from us after three days. °10. OTHER INSTRUCTIONS: _______________________________________________________________________________________________ _____________________________________________________________________________________________________________________________________ °_____________________________________________________________________________________________________________________________________ °_____________________________________________________________________________________________________________________________________ ° °WHEN TO CALL YOUR DOCTOR: °1. Fever over 101.0 °2. Nausea and/or vomiting. °3. Extreme swelling or bruising. °4. Continued bleeding from incision. °5. Increased pain, redness, or drainage from the incision. ° °The clinic staff is available to answer your questions during regular business hours.  Please don’t hesitate to call and ask to speak to one of the nurses for clinical concerns.  If you have a medical emergency, go to the nearest  emergency room or call 911.  A surgeon from [facility] is always on call at the hospital. ° °For further questions, please visit [email or web address]  ° ° °  Post Anesthesia Home Care Instructions ° °Activity: °Get plenty of rest for the remainder of the day. A responsible adult should stay with you for 24 hours following the procedure.  °For the next 24 hours, DO NOT: °-Drive a car °-Operate machinery °-Drink alcoholic beverages °-Take any medication unless instructed by your physician °-Make any legal decisions or sign important papers. ° °Meals: °Start with liquid foods such as gelatin or soup. Progress to regular foods as tolerated. Avoid greasy, spicy, heavy foods. If nausea and/or vomiting occur, drink only clear liquids until the nausea and/or vomiting subsides. Call your physician if vomiting continues. ° °Special Instructions/Symptoms: °Your throat may feel dry or sore from the anesthesia or the breathing tube placed in your throat during surgery. If this causes discomfort, gargle with warm salt water. The discomfort should disappear within 24 hours. ° °

## 2013-12-03 ENCOUNTER — Encounter (HOSPITAL_BASED_OUTPATIENT_CLINIC_OR_DEPARTMENT_OTHER): Payer: Self-pay | Admitting: General Surgery

## 2013-12-03 NOTE — Progress Notes (Signed)
Quick Note:  Inform patient of Pathology report,.Benign fibroadenoma. Good news.  hmi ______

## 2013-12-08 ENCOUNTER — Telehealth (INDEPENDENT_AMBULATORY_CARE_PROVIDER_SITE_OTHER): Payer: Self-pay

## 2013-12-08 NOTE — Telephone Encounter (Signed)
Pt called and given path result.

## 2013-12-09 ENCOUNTER — Ambulatory Visit (INDEPENDENT_AMBULATORY_CARE_PROVIDER_SITE_OTHER): Payer: BC Managed Care – PPO | Admitting: General Surgery

## 2013-12-09 ENCOUNTER — Telehealth (INDEPENDENT_AMBULATORY_CARE_PROVIDER_SITE_OTHER): Payer: Self-pay

## 2013-12-09 ENCOUNTER — Encounter (INDEPENDENT_AMBULATORY_CARE_PROVIDER_SITE_OTHER): Payer: Self-pay | Admitting: General Surgery

## 2013-12-09 VITALS — BP 126/72 | HR 76 | Temp 97.8°F | Resp 16 | Ht 63.0 in | Wt 120.0 lb

## 2013-12-09 DIAGNOSIS — D249 Benign neoplasm of unspecified breast: Secondary | ICD-10-CM

## 2013-12-09 DIAGNOSIS — D241 Benign neoplasm of right breast: Secondary | ICD-10-CM

## 2013-12-09 NOTE — Patient Instructions (Signed)
Your final pathology report shows no cancer. Fibrocystic disease, hyperplasia, and a fibroadenoma. You have been given a copy of this report.  It appears that you may have had an allergic reaction to the ChloraPrep skin prep. Fortunately this seems to be getting better and this should completely resolve in one week. If not please return to see Dr. Dalbert Batman  Otherwise the skin incision appears to be healing normally and there is no sign of infection.  Repeat mammograms in one year and annually thereafter.  Return to see Dr. Dalbert Batman as necessary.

## 2013-12-09 NOTE — Progress Notes (Signed)
Patient ID: Rhonda Deleon, female   DOB: April 08, 1966, 48 y.o.   MRN: [id number] History: This patient underwent right lumpectomy with a radioactive seed localization on March 10. Final pathology showed benign fibroadenoma, fibrocystic disease and usual ductal hyperplasia. Post op she developed a skin rash where the chloraprep was, she states this is similar to her left breast biopsy in the past. This has been itching. She's been taking Benadryl. It's been getting better. No fever or chills  Exam: Patient looks well. No distress Right breast lumpectomy incision looks good. Tiny volume loss. Localized skin rash lateral right breast. No hematoma. No drainage. No warmth. Doesn't look infected.  Assessment: Fibroadenoma and fibrocystic disease right breast, no direct wound complications following right lumpectomy with radioactive seed localization Skin rash. Suspect contact dermatitis from chlor prep. I told her about this and gave her the name of the substance. There doesn't seem to be any indication for further intervention.   Plan: 1% hydrocortisone cream twice a day to skin for one week She is to call me if this isn't mostly resolved in one week Annual mammography Otherwise see me as needed.     Edsel Petrin. Dalbert Batman, M.D., [facility] General and Minimally invasive Surgery Breast and Colorectal Surgery Office:   [phone] Pager:   [phone]

## 2013-12-09 NOTE — Telephone Encounter (Signed)
Informed patient of Pathology report,.Benign fibroadenoma. Good news  Per HI

## 2014-01-08 ENCOUNTER — Encounter (INDEPENDENT_AMBULATORY_CARE_PROVIDER_SITE_OTHER): Payer: Self-pay

## 2014-01-13 ENCOUNTER — Encounter (INDEPENDENT_AMBULATORY_CARE_PROVIDER_SITE_OTHER): Payer: Self-pay

## 2014-02-13 ENCOUNTER — Ambulatory Visit: Payer: BC Managed Care – PPO | Admitting: Nurse Practitioner

## 2014-04-10 ENCOUNTER — Telehealth (INDEPENDENT_AMBULATORY_CARE_PROVIDER_SITE_OTHER): Payer: Self-pay

## 2014-04-10 NOTE — Telephone Encounter (Signed)
Pt s/p right partial mastectomy in March. Pt states that she has started having bloody nipples. Pt wanted to get appt with Dr Dalbert Batman.  Pt states that she went to [facility] this week for a mammogram and a [place]. Pt advised that I would get with Holley Raring and Dr Dalbert Batman to see if they can get her worked into the schedule. Pt verbalized understanding and agrees with POC.

## 2014-04-12 NOTE — Telephone Encounter (Signed)
I will be happy to see.  hmi

## 2014-04-14 ENCOUNTER — Ambulatory Visit (INDEPENDENT_AMBULATORY_CARE_PROVIDER_SITE_OTHER): Payer: BC Managed Care – PPO | Admitting: General Surgery

## 2014-04-14 ENCOUNTER — Encounter (INDEPENDENT_AMBULATORY_CARE_PROVIDER_SITE_OTHER): Payer: Self-pay | Admitting: General Surgery

## 2014-04-14 ENCOUNTER — Other Ambulatory Visit (INDEPENDENT_AMBULATORY_CARE_PROVIDER_SITE_OTHER): Payer: Self-pay

## 2014-04-14 VITALS — BP 122/72 | HR 64 | Temp 98.0°F | Resp 18 | Ht 66.0 in | Wt 118.0 lb

## 2014-04-14 DIAGNOSIS — N6459 Other signs and symptoms in breast: Secondary | ICD-10-CM

## 2014-04-14 DIAGNOSIS — D241 Benign neoplasm of right breast: Secondary | ICD-10-CM

## 2014-04-14 DIAGNOSIS — N6452 Nipple discharge: Secondary | ICD-10-CM

## 2014-04-14 DIAGNOSIS — D242 Benign neoplasm of left breast: Secondary | ICD-10-CM

## 2014-04-14 NOTE — Patient Instructions (Signed)
You have an obvious bloody left nipple discharge for one month. We are able to reproduce this and it appears to be coming from a left nipple duct at the 9:00 position medially.  I do not feel an obvious mass within the breast, although your breasts are very lumpy.  Most likely this is a benign intraductal papilloma. There is a small chance this could be an early breast cancer.  You'll be referred back to [facility] health for imaging studies and a left breast ductogram  Return to see Dr. Dalbert Batman in one week. Eventually we will plan surgery to remove the area,clarify the diagnosis  and stop the bleeding.

## 2014-04-14 NOTE — Progress Notes (Signed)
Patient ID: Rhonda Deleon, female   DOB: 1966-02-01, 48 y.o.   MRN: [id number]  Chief Complaint  Patient presents with  . Routine Post Op    nipple discharge    HPI Rhonda Deleon is a 48 y.o. female.  She is self-referred for a new breast problem.  She gives a one-month history of a painless bloody left nipple discharge. She has never had any nipple discharge for. This is becoming worse, higher in volume. It is spontaneous. It spots her bra and the bedsheets at night. Denies trauma.  She went to [facility] mammography and a left breast ultrasound was performed on 04/07/2014 by Dr. Isaiah Blakes  No abnormalities were seen. They were unable to elicit any nipple discharge. Nothing further was done.  Patient states that the bleeding has picked up and is now daily.  She underwent a left partial mastectomy in November 2014 for what turned out to be a benign intraductal papilloma. She underwent a right partial mastectomy with radioactive seed localization on 12/02/2013 which turned out to be fibroadenoma, fibrocystic disease, and usual ductal hyperplasia. She recovered uneventfully from the surgeries. Neither of these problems were preceded by nipple discharge.  Family history reveals aggressive breast cancer in a maternal grandmother but no other relatives. No ovarian cancer.  The morbidities include seizure disorder, migraines, and the breast surgery. He's had a laparoscopic assisted hysterectomy and C-section through a Pfannenstiel incision.  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 She is allergic to chloroprep.  HPI  Past Medical History  Diagnosis Date  . Headache(784.0)     migraines  . Seizures     as child  . 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 Neuromuscular disorder     movement disorder  . Asthma   . Arthritis     psoriatic    Past Surgical History  Procedure Laterality Date  . Cesarean  section  1991  . Wisdom tooth extraction  as child  . Robotic assisted lap vaginal hysterectomy  12/05/11  . Abdominal hysterectomy      3/13-vag-LOA  . Breast lumpectomy with needle localization Left 08/15/2013    Procedure: BREAST LUMPECTOMY WITH NEEDLE LOCALIZATION;  Surgeon: Adin Hector, MD;  Location: [place];  Service: General;  Laterality: Left;  . Breast lumpectomy with radioactive seed localization Right 12/02/2013    Procedure: RIGHT BREAST PARTIAL MASTECOMY WITH RADIOACTIVE SEED LOCALIZATION;  Surgeon: Adin Hector, MD;  Location: [place];  Service: General;  Laterality: Right;    Family History  Problem Relation Age of Onset  . Hypertension Mother   . Cancer Maternal Grandmother     breast    Social History History  Substance Use Topics  . Smoking status: Former Smoker    Quit date: 09/25/1997  . Smokeless tobacco: Never Used  . Alcohol Use: 1.0 oz/week    2 drink(s) per week     Comment: social    Allergies  Allergen Reactions  . Adhesive [Tape]     Steri strips "made skin look like a burn"    . Chloraprep One Step [Chlorhexidine Gluconate] Hives, Itching and Rash    Current Outpatient Prescriptions  Medication Sig Dispense Refill  . Betamethasone Valerate 0.12 % foam       . chlorpheniramine-HYDROcodone (TUSSIONEX) 10-8 MG/5ML LQCR       . FLOVENT HFA 110 MCG/ACT inhaler       .  HYDROcodone-acetaminophen (NORCO/VICODIN) 5-325 MG per tablet Take 1-2 tablets by mouth every 4 (four) hours as needed.  30 tablet  0  . polyethylene glycol (MIRALAX / GLYCOLAX) packet Take 17 g by mouth daily.      . traMADol (ULTRAM) 50 MG tablet Take 1 tablet (50 mg total) by mouth every 6 (six) hours as needed for pain.  30 tablet  0  . VENTOLIN HFA 108 (90 BASE) MCG/ACT inhaler       . inFLIXimab (REMICADE) 100 MG injection Inject into the vein.       No current facility-administered medications for this visit.    Review of  Systems Review of Systems  Constitutional: Negative for fever, chills and unexpected weight change.  HENT: Negative for congestion, hearing loss, sore throat, trouble swallowing and voice change.   Eyes: Negative for visual disturbance.  Respiratory: Negative for cough and wheezing.   Cardiovascular: Negative for chest pain, palpitations and leg swelling.  Gastrointestinal: Negative for nausea, vomiting, abdominal pain, diarrhea, constipation, blood in stool, abdominal distention and anal bleeding.  Genitourinary: Negative for hematuria, vaginal bleeding and difficulty urinating.  Musculoskeletal: Negative for arthralgias.  Skin: Negative for rash and wound.  Neurological: Negative for seizures, syncope and headaches.  Hematological: Negative for adenopathy. Does not bruise/bleed easily.  Psychiatric/Behavioral: Negative for confusion.    Blood pressure 122/72, pulse 64, temperature 98 F (36.7 C), resp. rate 18, height 5\' 6"  (1.676 m), weight 118 lb (53.524 kg), last menstrual period 11/18/2011.  Physical Exam Physical Exam  Constitutional: She is oriented to person, place, and time. She appears well-developed and well-nourished. No distress.  Very pleasant. Cooperative. Good insight.  HENT:  Head: Normocephalic and atraumatic.  Nose: Nose normal.  Mouth/Throat: No oropharyngeal exudate.  Eyes: Conjunctivae and EOM are normal. Pupils are equal, round, and reactive to light. Left eye exhibits no discharge. No scleral icterus.  Neck: Neck supple. No JVD present. No tracheal deviation present. No thyromegaly present.  Cardiovascular: Normal rate, regular rhythm, normal heart sounds and intact distal pulses.   No murmur heard. Pulmonary/Chest: Effort normal and breath sounds normal. No respiratory distress. She has no wheezes. She has no rales. She exhibits no tenderness.    Breasts are small in size. Diffusely lumpy but nontender. Circumareolar scar right breast, 9:00 position well  healed. Transverse radial scar left breast, 9:00 position, well healed. I can elicit a bloody discharge repeatedly from the left nipple. The nipple duct is at the 9:00 position medially. There is no other skin changes. There is no axillary adenopathy.  Abdominal: Soft. Bowel sounds are normal. She exhibits no distension and no mass. There is no tenderness. There is no rebound and no guarding.  Infraumbilical laparoscopic scar. Pfannenstiel incision.  Musculoskeletal: She exhibits no edema and no tenderness.  Lymphadenopathy:    She has no cervical adenopathy.  Neurological: She is alert and oriented to person, place, and time. She exhibits normal muscle tone. Coordination normal.  Skin: Skin is warm. No rash noted. She is not diaphoretic. No erythema. No pallor.  Psychiatric: She has a normal mood and affect. Her behavior is normal. Judgment and thought content normal.    Data Reviewed Recent ultrasound report. All of my old records and operative reports and  pathology reports.  Assessment    Bloody nipple discharge, spontaneous, left nipple duct at 9:00 position. Considerations would be a ductal papilloma, sclerosing adenosis, less likely early breast cancer.  History of bilateral breast biopsy as detailed above for  benign disease  Family history aggressive breast cancer in maternal grandmother  Seizure disorder     Plan    Since this is draining a lot and easily identified, I am going to refer her back to [facility] health for an attempt at [place]. If they see a mass then  perhaps a biopsy can be performed.  If not, then I will simply excise this in the usual fashion in the OR.  Return to see me in 7-10 days. I told her that ultimately this very would need to be excised, most likely through a circumareolar incision using lacrimal duct probes under general anesthesia. I described this to her. I drew pictures. She understands all of this quite well. She is in full agreement.        Smitty Ackerley M 04/14/2014, 2:52 PM

## 2014-04-16 ENCOUNTER — Encounter (INDEPENDENT_AMBULATORY_CARE_PROVIDER_SITE_OTHER): Payer: BC Managed Care – PPO | Admitting: General Surgery

## 2014-04-23 ENCOUNTER — Encounter (INDEPENDENT_AMBULATORY_CARE_PROVIDER_SITE_OTHER): Payer: Self-pay | Admitting: General Surgery

## 2014-04-23 ENCOUNTER — Ambulatory Visit (INDEPENDENT_AMBULATORY_CARE_PROVIDER_SITE_OTHER): Payer: BC Managed Care – PPO | Admitting: General Surgery

## 2014-04-23 VITALS — BP 122/80 | HR 77 | Temp 98.8°F | Ht 63.0 in | Wt 118.0 lb

## 2014-04-23 DIAGNOSIS — N6452 Nipple discharge: Secondary | ICD-10-CM

## 2014-04-23 DIAGNOSIS — N6459 Other signs and symptoms in breast: Secondary | ICD-10-CM

## 2014-04-23 NOTE — Progress Notes (Signed)
Patient ID: Rhonda Deleon, female   DOB: 03-22-1966, 48 y.o.   MRN: [id number]  History: The patient was evaluated by Dr. Johnnette Gourd, but a ductogram was unsuccessful because the duct was so tiny it could not be cannulated. The left nipple bloody discharge has persisted. She also has little bit of tenderness laterally. The initial evaluation  is summarized below: She was self-referred for a new breast problem.  She gives a one-month history of a painless bloody left nipple discharge. She has never had any nipple discharge before. This is becoming worse, higher in volume. It is spontaneous. It spots her bra and the bedsheets at night. Denies trauma.  She went to [facility] mammography and a left breast ultrasound was performed on 04/07/2014 by Dr. Isaiah Blakes No abnormalities were seen. They were unable to elicit any nipple discharge. Nothing further was done.  Patient states that the bleeding has picked up and is now daily.  She underwent a left partial mastectomy in November 2014 for what turned out to be a benign intraductal papilloma. She underwent a right partial mastectomy with radioactive seed localization on 12/02/2013 which turned out to be fibroadenoma, fibrocystic disease, and usual ductal hyperplasia. She recovered uneventfully from the surgeries. Neither of these problems were preceded by nipple discharge.  Family history reveals aggressive breast cancer in a maternal grandmother but no other relatives. No ovarian cancer.  Her morbidities include seizure disorder, migraines, and the breast surgery. He's had a laparoscopic assisted hysterectomy and C-section through a Pfannenstiel incision.  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  She is allergic to chloroprep.   Past history, family history, social history, and review of systems are documented on the chart, unchanged and noncontributory except as described above.  Exam: Constitutional: She is oriented to person, place, and time. She  appears well-developed and well-nourished. No distress.  Very pleasant. Cooperative. Good insight.  HENT:  Head: Normocephalic and atraumatic.  Neck: Neck supple. No JVD present. No tracheal deviation present. No thyromegaly present.   Pulmonary/Chest: Effort normal and breath sounds normal. No respiratory distress. She has no wheezes. She has no rales. She exhibits no tenderness.    Breasts are small in size. Diffusely lumpy but nontender. Circumareolar scar right breast, 9:00 position well healed. Transverse radial scar left breast, 9:00 position, well healed. I can elicit a bloody discharge repeatedly from the left nipple. The nipple duct is at the 9:00 position medially. There is no other skin changes. There is no axillary adenopathy.  Musculoskeletal: She exhibits no edema and no tenderness.  Lymphadenopathy:  She has no cervical adenopathy.  Neurological: She is alert and oriented to person, place, and time. She exhibits normal muscle tone. Coordination normal.  Skin: Skin is warm. No rash noted. She is not diaphoretic. No erythema. No pallor.  Psychiatric: She has a normal mood and affect. Her behavior is normal. Judgment and thought content normal.   Assessment  Bloody left  nipple discharge, spontaneous, left nipple duct at 9:00 position. Considerations would be a ductal papilloma, sclerosing adenosis, less likely early breast cancer.  History of bilateral breast biopsy as detailed above for benign disease  Family history aggressive breast cancer in maternal grandmother  Seizure disorder   Plan  Schedule for left breast biopsy with excision of involved ductal system, circumareolar incision, Lacrimal duct probes.  I discussed the indications, details, techniques, numerous risk of the surgery with her. I drew pictures of the procedure for her. She is aware of the risk  of bleeding, infection, cosmetic deformity, reoperation if this is cancer, skin necrosis, and other unforeseen  problems. She understands all these issues all of her questions are answered.. She is in full agreement with this plan.   Edsel Petrin. Dalbert Batman, M.D., [facility] General and Minimally invasive Surgery Breast and Colorectal Surgery Office:   [phone] Pager:   [phone]

## 2014-04-23 NOTE — Patient Instructions (Signed)
Dr. Mila Palmer was unable to perform a ductogram because the nipple duct was too tiny.  You have continued to have a bloody discharge, which is not surprising.  You  will be scheduled for left breast biopsy and excision of the involved nipple duct under general anesthesia as an outpatient in the near future.

## 2014-05-01 ENCOUNTER — Encounter (HOSPITAL_BASED_OUTPATIENT_CLINIC_OR_DEPARTMENT_OTHER): Payer: Self-pay | Admitting: *Deleted

## 2014-05-01 NOTE — Progress Notes (Signed)
This will be her 3rd time here,no labs needed

## 2014-05-04 NOTE — H&P (Signed)
History:  The patient was evaluated by Dr. Johnnette Gourd, but a ductogram was unsuccessful because the duct was so tiny it could not be cannulated. The left nipple bloody discharge has persisted. She also has little bit of tenderness laterally.  The initial evaluation is summarized below:  She was self-referred for a new breast problem.  She gives a one-month history of a painless bloody left nipple discharge. She has never had any nipple discharge before. This is becoming worse, higher in volume. It is spontaneous. It spots her bra and the bedsheets at night. Denies trauma.  She went to [facility] mammography and a left breast ultrasound was performed on 04/07/2014 by Dr. Isaiah Blakes No abnormalities were seen. They were unable to elicit any nipple discharge. Nothing further was done.  Patient states that the bleeding has picked up and is now daily.  She underwent a left partial mastectomy in November 2014 for what turned out to be a benign intraductal papilloma. She underwent a right partial mastectomy with radioactive seed localization on 12/02/2013 which turned out to be fibroadenoma, fibrocystic disease, and usual ductal hyperplasia. She recovered uneventfully from the surgeries. Neither of these problems were preceded by nipple discharge.  Family history reveals aggressive breast cancer in a maternal grandmother but no other relatives. No ovarian cancer.  Her morbidities include seizure disorder, migraines, and the breast surgery. He's had a laparoscopic assisted hysterectomy and C-section through a Pfannenstiel incision.  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  She is allergic to chloroprep.   Past history, family history, social history, and review of systems are documented on the chart, unchanged and noncontributory except as described above.   Exam:  Constitutional: She is oriented to person, place, and time. She appears well-developed and well-nourished. No distress.  Very pleasant.  Cooperative. Good insight.  HENT:  Head: Normocephalic and atraumatic.  Neck: Neck supple. No JVD present. No tracheal deviation present. No thyromegaly present.  Pulmonary/Chest: Effort normal and breath sounds normal. No respiratory distress. She has no wheezes. She has no rales. She exhibits no tenderness.    Breasts are small in size. Diffusely lumpy but nontender. Circumareolar scar right breast, 9:00 position well healed. Transverse radial scar left breast, 9:00 position, well healed. I can elicit a bloody discharge repeatedly from the left nipple. The nipple duct is at the 9:00 position medially. There is no other skin changes. There is no axillary adenopathy.  Musculoskeletal: She exhibits no edema and no tenderness.  Lymphadenopathy:  She has no cervical adenopathy.  Neurological: She is alert and oriented to person, place, and time. She exhibits normal muscle tone. Coordination normal.  Skin: Skin is warm. No rash noted. She is not diaphoretic. No erythema. No pallor.  Psychiatric: She has a normal mood and affect. Her behavior is normal. Judgment and thought content normal.   Assessment  Bloody left nipple discharge, spontaneous, left nipple duct at 9:00 position. Considerations would be a ductal papilloma, sclerosing adenosis, less likely early breast cancer.  History of bilateral breast biopsy as detailed above for benign disease  Family history aggressive breast cancer in maternal grandmother  Seizure disorder   Plan  Schedule for left breast biopsy with excision of involved ductal system, circumareolar incision, Lacrimal duct probes.  I discussed the indications, details, techniques, numerous risk of the surgery with her. I drew pictures of the procedure for her. She is aware of the risk of bleeding, infection, cosmetic deformity, reoperation if this is cancer, skin necrosis, and other  unforeseen problems. She understands all these issues all of her questions are answered.. She  is in full agreement with this plan.      Edsel Petrin. Dalbert Batman, M.D., [facility]  General and Minimally invasive Surgery  Breast and Colorectal Surgery  Office: [phone]  Pager: [phone]

## 2014-05-07 ENCOUNTER — Ambulatory Visit (HOSPITAL_BASED_OUTPATIENT_CLINIC_OR_DEPARTMENT_OTHER)
Admission: RE | Admit: 2014-05-07 | Discharge: 2014-05-07 | Disposition: A | Discharge status: Home / Self Care | Payer: BC Managed Care – PPO | Source: Ambulatory Visit | Attending: General Surgery | Admitting: General Surgery

## 2014-05-07 ENCOUNTER — Ambulatory Visit (HOSPITAL_BASED_OUTPATIENT_CLINIC_OR_DEPARTMENT_OTHER): Payer: BC Managed Care – PPO | Admitting: Anesthesiology

## 2014-05-07 ENCOUNTER — Encounter (HOSPITAL_BASED_OUTPATIENT_CLINIC_OR_DEPARTMENT_OTHER)
Admission: RE | Disposition: A | Discharge status: Home / Self Care | Payer: Self-pay | Source: Ambulatory Visit | Attending: General Surgery

## 2014-05-07 ENCOUNTER — Encounter (HOSPITAL_BASED_OUTPATIENT_CLINIC_OR_DEPARTMENT_OTHER): Payer: Self-pay | Admitting: *Deleted

## 2014-05-07 ENCOUNTER — Encounter (HOSPITAL_BASED_OUTPATIENT_CLINIC_OR_DEPARTMENT_OTHER): Payer: BC Managed Care – PPO | Admitting: Anesthesiology

## 2014-05-07 DIAGNOSIS — N6452 Nipple discharge: Secondary | ICD-10-CM | POA: Diagnosis present

## 2014-05-07 DIAGNOSIS — D249 Benign neoplasm of unspecified breast: Secondary | ICD-10-CM | POA: Diagnosis not present

## 2014-05-07 DIAGNOSIS — G40909 Epilepsy, unspecified, not intractable, without status epilepticus: Secondary | ICD-10-CM | POA: Diagnosis not present

## 2014-05-07 DIAGNOSIS — N6019 Diffuse cystic mastopathy of unspecified breast: Secondary | ICD-10-CM | POA: Insufficient documentation

## 2014-05-07 DIAGNOSIS — N6459 Other signs and symptoms in breast: Secondary | ICD-10-CM | POA: Diagnosis present

## 2014-05-07 DIAGNOSIS — N6089 Other benign mammary dysplasias of unspecified breast: Secondary | ICD-10-CM | POA: Insufficient documentation

## 2014-05-07 DIAGNOSIS — Z803 Family history of malignant neoplasm of breast: Secondary | ICD-10-CM | POA: Diagnosis not present

## 2014-05-07 HISTORY — PX: BREAST DUCTAL SYSTEM EXCISION: SHX5242

## 2014-05-07 LAB — POCT HEMOGLOBIN-HEMACUE: Hemoglobin: 14.3 g/dL (ref 12.0–15.0)

## 2014-05-07 SURGERY — EXCISION DUCTAL SYSTEM BREAST
Anesthesia: General | Site: Breast | Laterality: Left

## 2014-05-07 MED ORDER — ONDANSETRON HCL 4 MG/2ML IJ SOLN
INTRAMUSCULAR | Status: DC | PRN
  Administered 2014-05-07: 4 mg via INTRAVENOUS

## 2014-05-07 MED ORDER — FENTANYL CITRATE 0.05 MG/ML IJ SOLN: 25.0000 ug | INTRAMUSCULAR | Status: DC | PRN

## 2014-05-07 MED ORDER — SCOPOLAMINE 1 MG/3DAYS TD PT72
1.0000 | MEDICATED_PATCH | Freq: Once | TRANSDERMAL | Status: DC
Start: 2014-05-07 — End: 2014-05-07
  Administered 2014-05-07: 1.5 mg via TRANSDERMAL

## 2014-05-07 MED ORDER — SODIUM CHLORIDE 0.9 % IJ SOLN: 3.0000 mL | Freq: Two times a day (BID) | INTRAMUSCULAR | Status: DC

## 2014-05-07 MED ORDER — SCOPOLAMINE 1 MG/3DAYS TD PT72
MEDICATED_PATCH | TRANSDERMAL | Status: AC
  Filled 2014-05-07: qty 1

## 2014-05-07 MED ORDER — LACTATED RINGERS IV SOLN
INTRAVENOUS | Status: DC
  Administered 2014-05-07 (×2): via INTRAVENOUS

## 2014-05-07 MED ORDER — SODIUM CHLORIDE 0.9 % IV SOLN: 250.0000 mL | INTRAVENOUS | Status: DC | PRN

## 2014-05-07 MED ORDER — HYDROMORPHONE HCL PF 1 MG/ML IJ SOLN
INTRAMUSCULAR | Status: AC
  Filled 2014-05-07: qty 1

## 2014-05-07 MED ORDER — OXYCODONE HCL 5 MG PO TABS
ORAL_TABLET | ORAL | Status: AC
  Filled 2014-05-07: qty 1

## 2014-05-07 MED ORDER — LIDOCAINE HCL (PF) 1 % IJ SOLN
INTRAMUSCULAR | Status: AC
  Filled 2014-05-07: qty 30

## 2014-05-07 MED ORDER — PROPOFOL 10 MG/ML IV BOLUS
INTRAVENOUS | Status: DC | PRN
  Administered 2014-05-07: 150 mg via INTRAVENOUS

## 2014-05-07 MED ORDER — PROPOFOL 10 MG/ML IV BOLUS
INTRAVENOUS | Status: AC
  Filled 2014-05-07: qty 20

## 2014-05-07 MED ORDER — CHLORHEXIDINE GLUCONATE 4 % EX LIQD
1.0000 | Freq: Once | CUTANEOUS | Status: DC
Start: 2014-05-08 — End: 2014-05-07

## 2014-05-07 MED ORDER — LIDOCAINE-EPINEPHRINE (PF) 1 %-1:200000 IJ SOLN
INTRAMUSCULAR | Status: AC
  Filled 2014-05-07: qty 10

## 2014-05-07 MED ORDER — MIDAZOLAM HCL 2 MG/2ML IJ SOLN
INTRAMUSCULAR | Status: AC
Start: 2014-05-07 — End: 2014-05-07
  Filled 2014-05-07: qty 2

## 2014-05-07 MED ORDER — BUPIVACAINE-EPINEPHRINE 0.5% -1:200000 IJ SOLN
INTRAMUSCULAR | Status: DC | PRN
  Administered 2014-05-07: 10 mL

## 2014-05-07 MED ORDER — OXYCODONE HCL 5 MG PO TABS
5.0000 mg | ORAL_TABLET | Freq: Once | ORAL | Status: AC | PRN
  Administered 2014-05-07: 5 mg via ORAL

## 2014-05-07 MED ORDER — FENTANYL CITRATE 0.05 MG/ML IJ SOLN
INTRAMUSCULAR | Status: AC
  Filled 2014-05-07: qty 6

## 2014-05-07 MED ORDER — BUPIVACAINE-EPINEPHRINE (PF) 0.5% -1:200000 IJ SOLN
INTRAMUSCULAR | Status: AC
  Filled 2014-05-07: qty 30

## 2014-05-07 MED ORDER — HYDROCODONE-ACETAMINOPHEN 5-325 MG PO TABS: 1.0000 | ORAL_TABLET | Freq: Four times a day (QID) | ORAL | Status: DC | PRN

## 2014-05-07 MED ORDER — FENTANYL CITRATE 0.05 MG/ML IJ SOLN: 50.0000 ug | INTRAMUSCULAR | Status: DC | PRN

## 2014-05-07 MED ORDER — ACETAMINOPHEN 325 MG PO TABS: 650.0000 mg | ORAL_TABLET | ORAL | Status: DC | PRN

## 2014-05-07 MED ORDER — SODIUM CHLORIDE 0.9 % IJ SOLN: 3.0000 mL | INTRAMUSCULAR | Status: DC | PRN

## 2014-05-07 MED ORDER — CEFAZOLIN SODIUM-DEXTROSE 2-3 GM-% IV SOLR
2.0000 g | INTRAVENOUS | Status: AC
  Administered 2014-05-07: 2 g via INTRAVENOUS

## 2014-05-07 MED ORDER — MIDAZOLAM HCL 2 MG/2ML IJ SOLN: 1.0000 mg | INTRAMUSCULAR | Status: DC | PRN

## 2014-05-07 MED ORDER — OXYCODONE HCL 5 MG/5ML PO SOLN: 5.0000 mg | Freq: Once | ORAL | Status: AC | PRN

## 2014-05-07 MED ORDER — DEXAMETHASONE SODIUM PHOSPHATE 4 MG/ML IJ SOLN
INTRAMUSCULAR | Status: DC | PRN
  Administered 2014-05-07: 10 mg via INTRAVENOUS

## 2014-05-07 MED ORDER — ACETAMINOPHEN 650 MG RE SUPP: 650.0000 mg | RECTAL | Status: DC | PRN

## 2014-05-07 MED ORDER — HYDROMORPHONE HCL PF 1 MG/ML IJ SOLN
0.2500 mg | INTRAMUSCULAR | Status: DC | PRN
  Administered 2014-05-07 (×2): 0.5 mg via INTRAVENOUS

## 2014-05-07 MED ORDER — SODIUM CHLORIDE 0.9 % IV SOLN: INTRAVENOUS | Status: DC

## 2014-05-07 MED ORDER — LIDOCAINE HCL (CARDIAC) 20 MG/ML IV SOLN
INTRAVENOUS | Status: DC | PRN
  Administered 2014-05-07: 40 mg via INTRAVENOUS

## 2014-05-07 MED ORDER — MIDAZOLAM HCL 5 MG/5ML IJ SOLN
INTRAMUSCULAR | Status: DC | PRN
  Administered 2014-05-07: 2 mg via INTRAVENOUS

## 2014-05-07 MED ORDER — FENTANYL CITRATE 0.05 MG/ML IJ SOLN
INTRAMUSCULAR | Status: DC | PRN
  Administered 2014-05-07: 50 ug via INTRAVENOUS
  Administered 2014-05-07: 25 ug via INTRAVENOUS
  Administered 2014-05-07 (×2): 50 ug via INTRAVENOUS
  Administered 2014-05-07: 25 ug via INTRAVENOUS

## 2014-05-07 MED ORDER — OXYCODONE HCL 5 MG PO TABS: 5.0000 mg | ORAL_TABLET | ORAL | Status: DC | PRN

## 2014-05-07 MED ORDER — CEFAZOLIN SODIUM-DEXTROSE 2-3 GM-% IV SOLR
INTRAVENOUS | Status: AC
  Filled 2014-05-07: qty 50

## 2014-05-07 SURGICAL SUPPLY — 58 items
ADH SKN CLS APL DERMABOND .7 (GAUZE/BANDAGES/DRESSINGS)
APL SKNCLS STERI-STRIP NONHPOA (GAUZE/BANDAGES/DRESSINGS)
APPLIER CLIP 9.375 MED OPEN (MISCELLANEOUS) ×2
APR CLP MED 9.3 20 MLT OPN (MISCELLANEOUS) ×1
BANDAGE ELASTIC 6 VELCRO ST LF (GAUZE/BANDAGES/DRESSINGS) IMPLANT
BENZOIN TINCTURE PRP APPL 2/3 (GAUZE/BANDAGES/DRESSINGS) IMPLANT
BLADE HEX COATED 2.75 (ELECTRODE) ×2 IMPLANT
BLADE SURG 15 STRL LF DISP TIS (BLADE) ×2 IMPLANT
BLADE SURG 15 STRL SS (BLADE) ×4
CANISTER SUCT 1200ML W/VALVE (MISCELLANEOUS) ×2 IMPLANT
CHLORAPREP W/TINT 26ML (MISCELLANEOUS) ×2 IMPLANT
CLIP APPLIE 9.375 MED OPEN (MISCELLANEOUS) ×1 IMPLANT
COVER MAYO STAND STRL (DRAPES) ×2 IMPLANT
COVER TABLE BACK 60X90 (DRAPES) ×2 IMPLANT
DECANTER SPIKE VIAL GLASS SM (MISCELLANEOUS) IMPLANT
DERMABOND ADVANCED (GAUZE/BANDAGES/DRESSINGS)
DERMABOND ADVANCED .7 DNX12 (GAUZE/BANDAGES/DRESSINGS) IMPLANT
DEVICE DUBIN W/COMP PLATE 8390 (MISCELLANEOUS) IMPLANT
DRAPE LAPAROSCOPIC ABDOMINAL (DRAPES) IMPLANT
DRAPE LAPAROTOMY TRNSV 102X78 (DRAPE) IMPLANT
DRAPE PED LAPAROTOMY (DRAPES) ×2 IMPLANT
DRAPE UTILITY XL STRL (DRAPES) ×2 IMPLANT
ELECT REM PT RETURN 9FT ADLT (ELECTROSURGICAL) ×2
ELECTRODE REM PT RTRN 9FT ADLT (ELECTROSURGICAL) ×1 IMPLANT
GAUZE SPONGE 4X4 16PLY XRAY LF (GAUZE/BANDAGES/DRESSINGS) IMPLANT
GLOVE EUDERMIC 7 POWDERFREE (GLOVE) ×2 IMPLANT
GOWN STRL REUS W/ TWL LRG LVL3 (GOWN DISPOSABLE) ×1 IMPLANT
GOWN STRL REUS W/ TWL XL LVL3 (GOWN DISPOSABLE) ×1 IMPLANT
GOWN STRL REUS W/TWL LRG LVL3 (GOWN DISPOSABLE) ×2
GOWN STRL REUS W/TWL XL LVL3 (GOWN DISPOSABLE) ×2
KIT MARKER MARGIN INK (KITS) IMPLANT
NDL HYPO 25X1 1.5 SAFETY (NEEDLE) ×1 IMPLANT
NEEDLE HYPO 22GX1.5 SAFETY (NEEDLE) IMPLANT
NEEDLE HYPO 25X1 1.5 SAFETY (NEEDLE) ×2 IMPLANT
NS IRRIG 1000ML POUR BTL (IV SOLUTION) ×2 IMPLANT
PACK BASIN DAY SURGERY FS (CUSTOM PROCEDURE TRAY) ×2 IMPLANT
PENCIL BUTTON HOLSTER BLD 10FT (ELECTRODE) ×2 IMPLANT
SLEEVE SCD COMPRESS KNEE MED (MISCELLANEOUS) IMPLANT
SPONGE GAUZE 4X4 12PLY STER LF (GAUZE/BANDAGES/DRESSINGS) IMPLANT
SPONGE LAP 4X18 X RAY DECT (DISPOSABLE) ×2 IMPLANT
STAPLER VISISTAT 35W (STAPLE) IMPLANT
STRIP CLOSURE SKIN 1/2X4 (GAUZE/BANDAGES/DRESSINGS) IMPLANT
SUT ETHILON 4 0 PS 2 18 (SUTURE) IMPLANT
SUT MNCRL AB 4-0 PS2 18 (SUTURE) IMPLANT
SUT SILK 2 0 SH (SUTURE) ×2 IMPLANT
SUT VIC AB 2-0 SH 27 (SUTURE)
SUT VIC AB 2-0 SH 27XBRD (SUTURE) IMPLANT
SUT VIC AB 3-0 FS2 27 (SUTURE) IMPLANT
SUT VIC AB 4-0 P-3 18XBRD (SUTURE) IMPLANT
SUT VIC AB 4-0 P3 18 (SUTURE)
SUT VICRYL 3-0 CR8 SH (SUTURE) ×2 IMPLANT
SUT VICRYL 4-0 PS2 18IN ABS (SUTURE) ×1 IMPLANT
SYR BULB 3OZ (MISCELLANEOUS) IMPLANT
SYRINGE 10CC LL (SYRINGE) ×2 IMPLANT
TAPE HYPAFIX 4 X10 (GAUZE/BANDAGES/DRESSINGS) IMPLANT
TOWEL OR NON WOVEN STRL DISP B (DISPOSABLE) ×2 IMPLANT
TUBE CONNECTING 20X1/4 (TUBING) ×2 IMPLANT
YANKAUER SUCT BULB TIP NO VENT (SUCTIONS) ×2 IMPLANT

## 2014-05-07 NOTE — Op Note (Signed)
Patient Name:           Rhonda Deleon   Date of Surgery:        05/07/2014  Pre op Diagnosis:      Bloody nipple discharge left breast  Post op Diagnosis:    Same  Procedure:                 Excision left breast ductal system with margin assessment  Surgeon:                     Edsel Petrin. Dalbert Batman, M.D., FACS  Assistant:                      None  Operative Indications:      She was self-referred for a new breast problem.  She gives a one-month history of a painless bloody left nipple discharge. She has never had any nipple discharge before. This is becoming worse, higher in volume. It is spontaneous. It spots her bra and the bedsheets at night. Denies trauma.  She went to [facility] mammography and a left breast ultrasound was performed on 04/07/2014 by Dr. Isaiah Blakes No abnormalities were seen.  An attempt at ductogram was unsuccessful. Patient states that the bleeding has picked up and is now daily.  She underwent a left partial mastectomy in November 2014 for what turned out to be a benign intraductal papilloma. She underwent a right partial mastectomy with radioactive seed localization on 12/02/2013 which turned out to be fibroadenoma, fibrocystic disease, and usual ductal hyperplasia. She recovered uneventfully from the surgeries. Neither of these problems were preceded by nipple discharge.  Family history reveals aggressive breast cancer in a maternal grandmother but no other relatives. No ovarian cancer.  Her morbidities include seizure disorder, migraines, and the breast surgery. He's had a laparoscopic assisted hysterectomy and C-section through a Pfannenstiel incision.    Operative Findings:       We were able to place a small lacrimal duct probe into the involved nipple duct, left breast, 9:00 position and the probe tract medially at the 9:00 position. During the dissection we were able to identify the duct that was involved and to mark it with a light tan Vicryl suture.There was no  gross palpable finding  Procedure in Detail:          The patient was brought to the operating room. Gen. Anesthesia with LMA device was induced. Left breast was prepped and draped in sterile fashion. Surgical time out was performed. Intravenous antibiotics were given. I can reproduce the bloody discharge from the left nipple with the duct at the 9:00 position. This was intubated with a tiny lacrimal duct probe and the probe tract fairly superficially and directly medially at the 9:00 position. A circumareolar incision was made at the areolar margin medially. We dissected out the involved duct divided it and marked it with a light tan Vicryl suture. We then dissected all the tissue for about 2-1/2 cm in all directions. The specimen was removed and marked with silk sutures at 3 cardinal positions to orient the pathologist. Sent fresh to pathology. Hemostasis was excellent and achieved with electrocautery. It was irrigated with saline. The breast tissues were reapproximated with interrupted sutures of 3-0 Vicryl and the skin closed with a running 4-0 Monocryl and Dermabond. Breast binder was placed the patient taken to PACU in stable condition. EBL 10 cc. Counts correct. Complications none.     Edsel Petrin. Dalbert Batman, M.D.,  FACS General and Minimally Invasive Surgery Breast and Colorectal Surgery  05/07/2014 9:34 AM

## 2014-05-07 NOTE — Transfer of Care (Signed)
Immediate Anesthesia Transfer of Care Note  Patient: Rhonda Deleon  Procedure(s) Performed: Procedure(s): EXCISION LEFT BREAST DUCTAL SYSTEM  (Left)  Patient Location: PACU  Anesthesia Type:General  Level of Consciousness: awake, alert , oriented and patient cooperative  Airway & Oxygen Therapy: Patient Spontanous Breathing and Patient connected to face mask oxygen  Post-op Assessment: Report given to PACU RN and Post -op Vital signs reviewed and stable  Post vital signs: Reviewed and stable  Complications: No apparent anesthesia complications

## 2014-05-07 NOTE — Anesthesia Postprocedure Evaluation (Signed)
  Anesthesia Post-op Note  Patient: Rhonda Deleon  Procedure(s) Performed: Procedure(s): EXCISION LEFT BREAST DUCTAL SYSTEM  (Left)  Patient Location: PACU  Anesthesia Type:General  Level of Consciousness: awake and alert   Airway and Oxygen Therapy: Patient Spontanous Breathing  Post-op Pain: mild  Post-op Assessment: Post-op Vital signs reviewed, Patient's Cardiovascular Status Stable and Respiratory Function Stable  Post-op Vital Signs: Reviewed  Filed Vitals:   05/07/14 1015  BP: 126/72  Pulse: 71  Temp:   Resp: 12    Complications: No apparent anesthesia complications

## 2014-05-07 NOTE — Interval H&P Note (Signed)
History and Physical Interval Note:  05/07/2014 8:14 AM  Rhonda Deleon  has presented today for surgery, with the diagnosis of left breast discharge  The various methods of treatment have been discussed with the patient and family. After consideration of risks, benefits and other options for treatment, the patient has consented to  Procedure(s): [place]  (Left) as a surgical intervention .  The patient's history has been reviewed, patient examined today, no change in status, stable for surgery.  I have reviewed the patient's chart and labs.  Questions were answered to the patient's satisfaction.     Adin Hector

## 2014-05-07 NOTE — Anesthesia Preprocedure Evaluation (Addendum)
Anesthesia Evaluation  Patient identified by MRN, date of birth, ID band Patient awake    Reviewed: Allergy & Precautions, H&P , NPO status , Patient's Chart, lab work & pertinent test results  Airway Mallampati: II TM Distance: >3 FB Neck ROM: Full    Dental no notable dental hx. (+) Teeth Intact, Dental Advisory Given   Pulmonary asthma , former smoker,  breath sounds clear to auscultation  Pulmonary exam normal       Cardiovascular negative cardio ROS  Rhythm:Regular Rate:Normal     Neuro/Psych  Headaches, Seizures -, Well Controlled,   Neuromuscular disease negative psych ROS   GI/Hepatic negative GI ROS, Neg liver ROS,   Endo/Other  negative endocrine ROS  Renal/GU negative Renal ROS  negative genitourinary   Musculoskeletal   Abdominal   Peds  Hematology negative hematology ROS (+)   Anesthesia Other Findings   Reproductive/Obstetrics negative OB ROS                          Anesthesia Physical Anesthesia Plan  ASA: II  Anesthesia Plan: General   Post-op Pain Management:    Induction: Intravenous  Airway Management Planned: LMA  Additional Equipment:   Intra-op Plan:   Post-operative Plan: Extubation in OR  Informed Consent: I have reviewed the patients History and Physical, chart, labs and discussed the procedure including the risks, benefits and alternatives for the proposed anesthesia with the patient or authorized representative who has indicated his/her understanding and acceptance.   Dental advisory given  Plan Discussed with: CRNA  Anesthesia Plan Comments:         Anesthesia Quick Evaluation

## 2014-05-07 NOTE — Discharge Instructions (Signed)
[facility] °Office Phone Number [phone] ° °BREAST BIOPSY/ PARTIAL MASTECTOMY: POST OP INSTRUCTIONS ° °Always review your discharge instruction sheet given to you by the facility where your surgery was performed. ° °IF YOU HAVE DISABILITY OR FAMILY LEAVE FORMS, YOU MUST BRING THEM TO THE OFFICE FOR PROCESSING.  DO NOT GIVE THEM TO YOUR DOCTOR. ° °1. A prescription for pain medication may be given to you upon discharge.  Take your pain medication as prescribed, if needed.  If narcotic pain medicine is not needed, then you may take acetaminophen (Tylenol) or ibuprofen (Advil) as needed. °2. Take your usually prescribed medications unless otherwise directed °3. If you need a refill on your pain medication, please contact your pharmacy.  They will contact our office to request authorization.  Prescriptions will not be filled after 5pm or on week-ends. °4. You should eat very light the first 24 hours after surgery, such as soup, crackers, pudding, etc.  Resume your normal diet the day after surgery. °5. Most patients will experience some swelling and bruising in the breast.  Ice packs and a good support bra will help.  Swelling and bruising can take several days to resolve.  °6. It is common to experience some constipation if taking pain medication after surgery.  Increasing fluid intake and taking a stool softener will usually help or prevent this problem from occurring.  A mild laxative (Milk of Magnesia or Miralax) should be taken according to package directions if there are no bowel movements after 48 hours. °7. Unless discharge instructions indicate otherwise, you may remove your bandages 24-48 hours after surgery, and you may shower at that time.  You may have steri-strips (small skin tapes) in place directly over the incision.  These strips should be left on the skin for 7-10 days.  If your surgeon used skin glue on the incision, you may shower in 24 hours.  The glue will flake off over the  next 2-3 weeks.  Any sutures or staples will be removed at the office during your follow-up visit. °8. ACTIVITIES:  You may resume regular daily activities (gradually increasing) beginning the next day.  Wearing a good support bra or sports bra minimizes pain and swelling.  You may have sexual intercourse when it is comfortable. °a. You may drive when you no longer are taking prescription pain medication, you can comfortably wear a seatbelt, and you can safely maneuver your car and apply brakes. °b. RETURN TO WORK:  ______________________________________________________________________________________ °9. You should see your doctor in the office for a follow-up appointment approximately two weeks after your surgery.  Your doctor’s nurse will typically make your follow-up appointment when she calls you with your pathology report.  Expect your pathology report 2-3 business days after your surgery.  You may call to check if you do not hear from us after three days. °10. OTHER INSTRUCTIONS: _______________________________________________________________________________________________ _____________________________________________________________________________________________________________________________________ °_____________________________________________________________________________________________________________________________________ °_____________________________________________________________________________________________________________________________________ ° °WHEN TO CALL YOUR DOCTOR: °1. Fever over 101.0 °2. Nausea and/or vomiting. °3. Extreme swelling or bruising. °4. Continued bleeding from incision. °5. Increased pain, redness, or drainage from the incision. ° °The clinic staff is available to answer your questions during regular business hours.  Please don’t hesitate to call and ask to speak to one of the nurses for clinical concerns.  If you have a medical emergency, go to the nearest  emergency room or call 911.  A surgeon from [facility] is always on call at the hospital. ° °For further questions, please visit [email or web address]  ° ° °  Post Anesthesia Home Care Instructions ° °Activity: °Get plenty of rest for the remainder of the day. A responsible adult should stay with you for 24 hours following the procedure.  °For the next 24 hours, DO NOT: °-Drive a car °-Operate machinery °-Drink alcoholic beverages °-Take any medication unless instructed by your physician °-Make any legal decisions or sign important papers. ° °Meals: °Start with liquid foods such as gelatin or soup. Progress to regular foods as tolerated. Avoid greasy, spicy, heavy foods. If nausea and/or vomiting occur, drink only clear liquids until the nausea and/or vomiting subsides. Call your physician if vomiting continues. ° °Special Instructions/Symptoms: °Your throat may feel dry or sore from the anesthesia or the breathing tube placed in your throat during surgery. If this causes discomfort, gargle with warm salt water. The discomfort should disappear within 24 hours. ° °

## 2014-05-07 NOTE — Anesthesia Procedure Notes (Signed)
Procedure Name: LMA Insertion Date/Time: 05/07/2014 8:46 AM Performed by: Lyndee Leo Pre-anesthesia Checklist: Patient identified, Emergency Drugs available, Suction available and Patient being monitored Patient Re-evaluated:Patient Re-evaluated prior to inductionOxygen Delivery Method: Circle System Utilized Preoxygenation: Pre-oxygenation with 100% oxygen Intubation Type: IV induction Ventilation: Mask ventilation without difficulty LMA: LMA inserted LMA Size: 4.0 Number of attempts: 1 Airway Equipment and Method: bite block Placement Confirmation: positive ETCO2 Tube secured with: Tape Dental Injury: Teeth and Oropharynx as per pre-operative assessment

## 2014-05-08 ENCOUNTER — Encounter (HOSPITAL_BASED_OUTPATIENT_CLINIC_OR_DEPARTMENT_OTHER): Payer: Self-pay | Admitting: General Surgery

## 2014-05-08 NOTE — Addendum Note (Signed)
Addendum created 05/08/14 8366 by Tawni Millers, CRNA   Modules edited: Charges VN

## 2014-05-10 NOTE — Progress Notes (Signed)
Quick Note:  Inform patient of Pathology report,.Intraductal Papilloma, as suspected. No cancer. No atypia. Good news.  hmi ______

## 2014-05-11 ENCOUNTER — Telehealth (INDEPENDENT_AMBULATORY_CARE_PROVIDER_SITE_OTHER): Payer: Self-pay

## 2014-05-11 ENCOUNTER — Encounter (INDEPENDENT_AMBULATORY_CARE_PROVIDER_SITE_OTHER): Payer: Self-pay

## 2014-05-11 NOTE — Telephone Encounter (Signed)
Patient aware of path indicating intraductal papilloma , no cancer . Appt 05/25/14 3:45 with Dr. Dalbert Batman

## 2014-05-25 ENCOUNTER — Ambulatory Visit (INDEPENDENT_AMBULATORY_CARE_PROVIDER_SITE_OTHER): Payer: BC Managed Care – PPO | Admitting: General Surgery

## 2014-05-25 ENCOUNTER — Encounter (INDEPENDENT_AMBULATORY_CARE_PROVIDER_SITE_OTHER): Payer: Self-pay | Admitting: General Surgery

## 2014-05-25 VITALS — BP 136/72 | HR 76 | Temp 98.8°F | Ht 63.0 in | Wt 117.4 lb

## 2014-05-25 DIAGNOSIS — N6452 Nipple discharge: Secondary | ICD-10-CM

## 2014-05-25 DIAGNOSIS — N6459 Other signs and symptoms in breast: Secondary | ICD-10-CM

## 2014-05-25 DIAGNOSIS — D249 Benign neoplasm of unspecified breast: Secondary | ICD-10-CM

## 2014-05-25 DIAGNOSIS — D242 Benign neoplasm of left breast: Secondary | ICD-10-CM

## 2014-05-25 NOTE — Progress Notes (Signed)
Patient ID: Rhonda Deleon, female   DOB: Apr 06, 1966, 48 y.o.   MRN: [id number]  History: This patient underwent excision of left breast ductal system the system was margin assessment on 05/07/2014 for bloody nipple discharge. Final pathology report shows a benign intraductal papilloma. She has no complaints about wound healing. I gave her a copy of the pathology reports and  discussed it with her.  Exam: Patient was well. Good spirits Left breast is examined. Nipple areolar complex is normal. Incision is healing normally. No fluid collection or hematoma. No infection  Assessment: Papilloma left breast, periareolar, perioperatively following excision  Plan: Annual mammography and annual breast exam Return to see me as needed.   Edsel Petrin. Dalbert Batman, M.D., [facility] General and Minimally invasive Surgery Breast and Colorectal Surgery Office:   [phone] Pager:   [phone]

## 2014-05-25 NOTE — Patient Instructions (Signed)
Your left breast incision appears to be healing normally without any surgical complications. There doesn't appear to be any infection or fluid collections.  We discussed her pathology report which shows a benign papilloma. This explains the discharge.  Be sure to get an annual breast exam by your regular physician  Be sure to get annual mammograms  Return to see Dr. Dalbert Batman as needed.

## 2014-07-07 ENCOUNTER — Ambulatory Visit (INDEPENDENT_AMBULATORY_CARE_PROVIDER_SITE_OTHER): Payer: BC Managed Care – PPO | Admitting: Nurse Practitioner

## 2014-07-07 ENCOUNTER — Encounter: Payer: Self-pay | Admitting: Nurse Practitioner

## 2014-07-07 VITALS — BP 137/78 | HR 77 | Ht 63.0 in | Wt 116.8 lb

## 2014-07-07 DIAGNOSIS — G43909 Migraine, unspecified, not intractable, without status migrainosus: Secondary | ICD-10-CM

## 2014-07-07 DIAGNOSIS — G249 Dystonia, unspecified: Secondary | ICD-10-CM

## 2014-07-07 MED ORDER — SUMATRIPTAN SUCCINATE 100 MG PO TABS: 100.0000 mg | ORAL_TABLET | ORAL | Status: DC | PRN

## 2014-07-07 NOTE — Progress Notes (Signed)
[facility]  PATIENT: Rhonda Deleon DOB: 1966-05-17   REASON FOR VISIT: for migraines  HISTORY OF PRESENT ILLNESS:Patient returns for followup after last visit 02/14/13.  She has  nocturnal movement disorder and also migraines. When last seen she was placed on Oxtellar XR and she  felt like she had the same side effects as on carbamazepine. She weaned herself off of the medication. She has had left and right partial mastectomy since last seen. She was placed on Remicade by her rheumatologist and her dystonia movements got much better. She reports that her migraines have gotten a little worse since being on Apremilast. Her labs are followed by rheumatology  She has taken Imitrex shots in the past for migraines. She is wanting a prescription for oral preparation. She is no longer taking vitamin B12. She occasionally takes Ultram. She returns for reevaluation   REVIEW OF SYSTEMS: Full 14 system review of systems performed and notable only for those listed, all others are neg:  Constitutional: N/A  Cardiovascular: N/A  Ear/Nose/Throat: N/A  Skin: N/A  Eyes: N/A  Respiratory: N/A  Gastroitestinal: N/A  Hematology/Lymphatic: N/A  Endocrine: N/A Musculoskeletal: Joint pain, joint swelling treated by rheumatology Allergy/Immunology: N/A  Neurological: Headache Psychiatric: N/A Sleep : NA   ALLERGIES: Allergies  Allergen Reactions  . Adhesive [Tape]     Steri strips "made skin look like a burn"    . Chloraprep One Step [Chlorhexidine Gluconate] Hives, Itching and Rash    HOME MEDICATIONS: Outpatient Prescriptions Prior to Visit  Medication Sig Dispense Refill  . Apremilast (OTEZLA) 30 MG TABS Take by mouth.      . Betamethasone Valerate 0.12 % foam       . chlorpheniramine-HYDROcodone (TUSSIONEX) 10-8 MG/5ML LQCR as needed.       Marland Kitchen FLOVENT HFA 110 MCG/ACT inhaler Inhale 2 puffs into the lungs as needed.       Marland Kitchen HYDROcodone-acetaminophen (NORCO) 5-325 MG per  tablet Take 1-2 tablets by mouth every 6 (six) hours as needed for moderate pain or severe pain.  30 tablet  0  . polyethylene glycol (MIRALAX / GLYCOLAX) packet Take 17 g by mouth daily.      . traMADol (ULTRAM) 50 MG tablet Take 1 tablet (50 mg total) by mouth every 6 (six) hours as needed for pain.  30 tablet  0  . VENTOLIN HFA 108 (90 BASE) MCG/ACT inhaler       . Adalimumab (HUMIRA PEN) 40 MG/0.8ML PNKT Inject into the skin every 14 (fourteen) days.       No facility-administered medications prior to visit.    PAST MEDICAL HISTORY: Past Medical History  Diagnosis Date  . Headache(784.0)     migraines  . Seizures     as child  . 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 Neuromuscular disorder     movement disorder  . Asthma   . Arthritis     psoriatic    PAST SURGICAL HISTORY: Past Surgical History  Procedure Laterality Date  . Cesarean section  1991  . Wisdom tooth extraction  as child  . Robotic assisted lap vaginal hysterectomy  12/05/11  . Abdominal hysterectomy      3/13-vag-LOA  . Breast lumpectomy with needle localization Left 08/15/2013    Procedure: BREAST LUMPECTOMY WITH NEEDLE LOCALIZATION;  Surgeon: Adin Hector, MD;  Location: [facility];  Service: General;  Laterality: Left;  .  Breast lumpectomy with radioactive seed localization Right 12/02/2013    Procedure: RIGHT BREAST PARTIAL MASTECOMY WITH RADIOACTIVE SEED LOCALIZATION;  Surgeon: Adin Hector, MD;  Location: [facility];  Service: General;  Laterality: Right;  . Breast ductal system excision Left 05/07/2014    Procedure: Pyote ;  Surgeon: Adin Hector, MD;  Location: [facility];  Service: General;  Laterality: Left;    FAMILY HISTORY: Family History  Problem Relation Age of Onset  . Hypertension Mother   . Cancer Maternal  Grandmother     breast    SOCIAL HISTORY: History   Social History  . Marital Status: Married    Spouse Name: N/A    Number of Children: N/A  . Years of Education: N/A   Occupational History  . Not on file.   Social History Main Topics  . Smoking status: Former Smoker    Quit date: 09/25/1997  . Smokeless tobacco: Never Used  . Alcohol Use: 1.0 oz/week    2 drink(s) per week     Comment: social  . Drug Use: No  . Sexual Activity: Yes    Birth Control/ Protection: Surgical   Other Topics Concern  . Not on file   Social History Narrative  . No narrative on file     PHYSICAL EXAM  Filed Vitals:   07/07/14 1404  BP: 137/78  Pulse: 77  Height: 5\' 3"  (1.6 m)  Weight: 116 lb 12.8 oz (52.98 kg)   Body mass index is 20.7 kg/(m^2). General: well developed, well nourished, seated, in no evident distress  Head: head normocephalic and atraumatic. Oropharynx benign  Neck: supple with no carotid or supraclavicular bruits  Cardiovascular: regular rate and rhythm, no murmurs  Neurologic Exam  Mental Status: Awake and fully alert. Oriented to place and time. Follows all commands .  Cranial Nerves: Pupils equal, briskly reactive to light. Extraocular movements full without nystagmus. Visual fields full to confrontation. Hearing intact and symmetric to finger snap. Facial sensation intact. Face, tongue, palate move normally and symmetrically. Neck flexion and extension normal.  Motor: Normal bulk and tone. Normal strength in all tested extremity muscles. No focal weakness  Coordination: Rapid alternating movements normal in all extremities. Finger-to-nose and heel-to-shin performed accurately bilaterally. No dysmetria or ataxia  Gait and Station: Arises from chair without difficulty. Stance is normal. Gait demonstrates normal stride length and balance . Able to heel, toe and tandem walk without difficulty.  Reflexes: 1+ and symmetric. Toes downgoing.      DIAGNOSTIC DATA (LABS,  IMAGING, TESTING)   ASSESSMENT AND PLAN  48 y.o. year old female  has a past medical history of Headache(784.0); dystonic movement disorder and Arthritis, here to followup. Her dystonic movements are stable without medication. She has nothing to take acutely for migraine.  Try Imitrex 100mg  1/2 to 1 tab prn migraine, will fill RX If headaches do not improve, keep a record for about a month and call for an additional appt Otherwise followup yearly and when necessary. Vst time face to face 26 min Dennie Bible, [facility], [facility], APRN  [facility] [address], [place], [place] [address] [phone]

## 2014-07-07 NOTE — Patient Instructions (Signed)
Try Imitrex 100mg  1/2 to 1 tab prn migraine, will fill RX If headaches do not improve, keep a record for about a month and call for an additional appt Otherwise followup yearly and when necessary

## 2014-07-08 ENCOUNTER — Telehealth: Payer: Self-pay | Admitting: Neurology

## 2014-07-08 MED ORDER — SUMATRIPTAN SUCCINATE 100 MG PO TABS: 100.0000 mg | ORAL_TABLET | ORAL | Status: DC | PRN

## 2014-07-08 NOTE — Telephone Encounter (Signed)
Rx has been resent.  I called the patient back, got no answer.  Left message.

## 2014-07-08 NOTE — Telephone Encounter (Signed)
Patient calling to state that her Imitrex was never called into her pharmacy, please return call and advise.

## 2014-07-09 NOTE — Progress Notes (Signed)
I agree with the assessment and plan as directed by NP .The patient is known to me .   Rosalind Guido, MD  

## 2014-07-10 ENCOUNTER — Other Ambulatory Visit: Payer: Self-pay

## 2014-07-27 ENCOUNTER — Encounter: Payer: Self-pay | Admitting: Nurse Practitioner

## 2014-09-23 ENCOUNTER — Other Ambulatory Visit: Payer: Self-pay | Admitting: Radiology

## 2014-09-25 HISTORY — PX: MASTECTOMY: SHX3

## 2014-11-05 ENCOUNTER — Other Ambulatory Visit: Payer: Self-pay | Admitting: Dermatology

## 2015-06-22 ENCOUNTER — Other Ambulatory Visit: Payer: Self-pay | Admitting: Internal Medicine

## 2015-06-22 DIAGNOSIS — E0590 Thyrotoxicosis, unspecified without thyrotoxic crisis or storm: Secondary | ICD-10-CM

## 2015-06-23 ENCOUNTER — Ambulatory Visit
Admission: RE | Admit: 2015-06-23 | Discharge: 2015-06-23 | Disposition: A | Discharge status: Home / Self Care | Payer: BLUE CROSS/BLUE SHIELD | Source: Ambulatory Visit | Attending: Internal Medicine | Admitting: Internal Medicine

## 2015-06-23 DIAGNOSIS — E0590 Thyrotoxicosis, unspecified without thyrotoxic crisis or storm: Secondary | ICD-10-CM

## 2015-06-25 ENCOUNTER — Other Ambulatory Visit: Payer: Self-pay | Admitting: Internal Medicine

## 2015-06-25 DIAGNOSIS — E041 Nontoxic single thyroid nodule: Secondary | ICD-10-CM

## 2015-06-29 ENCOUNTER — Other Ambulatory Visit (HOSPITAL_COMMUNITY)
Admission: RE | Admit: 2015-06-29 | Discharge: 2015-06-29 | Disposition: A | Discharge status: Home / Self Care | Payer: BLUE CROSS/BLUE SHIELD | Source: Ambulatory Visit | Attending: Interventional Radiology | Admitting: Interventional Radiology

## 2015-06-29 ENCOUNTER — Ambulatory Visit
Admission: RE | Admit: 2015-06-29 | Discharge: 2015-06-29 | Disposition: A | Discharge status: Home / Self Care | Payer: BLUE CROSS/BLUE SHIELD | Source: Ambulatory Visit | Attending: Internal Medicine | Admitting: Internal Medicine

## 2015-06-29 DIAGNOSIS — E041 Nontoxic single thyroid nodule: Secondary | ICD-10-CM | POA: Insufficient documentation

## 2015-07-07 ENCOUNTER — Ambulatory Visit: Payer: Self-pay | Admitting: Nurse Practitioner

## 2015-07-09 ENCOUNTER — Ambulatory Visit: Payer: BC Managed Care – PPO | Admitting: Nurse Practitioner

## 2015-07-14 ENCOUNTER — Ambulatory Visit: Payer: Self-pay | Admitting: Otolaryngology

## 2015-07-14 NOTE — H&P (Signed)
  Assessment  Hyperthyroidism (242.90) (E05.90). Psoriatic arthritis (696.0) (L40.50). Papillary thyroid carcinoma (193) (C73). Discussed  Hyperthyroidism with an associated 1.1 cm papillary carcinoma. Recommend total thyroidectomy. Risks and benefits were discussed in detail. Complications were discussed including hypocalcemia and recurrent nerve injury. All questions were answered. A handout was provided. We will schedule at her convenience. Reason For Visit  Rhonda Deleon is here today at the kind request of Balan, Bindubal for consultation and opinion for thyroid cancer. HPI  History of hyperthyroidism with tachycardia and palpitations. Workup revealed hyperthyroidism and a thyroid nodule, 1.1 cm on the left. FNA revealed papillary cancer. Allergies  Adhesive Tape Hibiclens. Current Meds  MethIMAzole 5 MG Oral Tablet;; RPT TraMADol HCl - 50 MG Oral Tablet;; RPT Ventolin HFA 108 (90 Base) MCG/ACT Inhalation Aerosol Solution;; RPT Gabapentin 100 MG Oral Capsule;; RPT Metoprolol Succinate ER 50 MG Oral Tablet Extended Release 24 Hour;; RPT Cosentyx 150 MG/ML Subcutaneous Solution Prefilled Syringe;; RPT. Active Problems  Arthritis   (716.90) (M19.90) Hyperthyroidism   (242.90) (E05.90) Psoriatic arthritis   (696.0) (L40.50). Rhonda Deleon  Breast Surgery Lumpectomy Breast Surgery Partial Mastectomy Oral Surgery Tooth Extraction. Family Hx  Family history of Alzheimer's disease (V17.2) (Z82.0) Family history of breast cancer (V16.3) (Z80.3) Family history of rheumatoid arthritis (V17.7) (Z82.61). Personal Hx  Alcohol use (V49.89) (Z78.9); 2 drinks day or fewer Caffeine use (V49.89) (F15.90); 2 cups daily Former smoker 1995 [phone]) [phone]). ROS  12 system ROS was obtained and reviewed on the Health Maintenance form dated today.  Positive responses are shown above.  If the symptom is not checked, the patient has denied it. Vital Signs   Recorded by Iran Sizer on 12 Jul 2015  02:52 PM BP:102/60,  BSA Calculated: 1.49 ,  BMI Calculated: 18.66 ,  Weight: 107 lb , BMI: 18.7 kg/m2,  Height: 5 ft 3.5 in. Physical Exam  APPEARANCE: Well developed, well nourished, in no acute distress.  Normal affect, in a pleasant mood.  Oriented to time, place and person. COMMUNICATION: Normal voice   HEAD & FACE:  No scars, lesions or masses of head and face.  Sinuses nontender to palpation.  Salivary glands without mass or tenderness.  Facial strength symmetric.  No facial lesion, scars, or mass. EYES: EOMI with normal primary gaze alignment. Visual acuity grossly intact.  PERRLA EXTERNAL EAR & NOSE: No scars, lesions or masses  EAC & TYMPANIC MEMBRANE:  EAC shows no obstructing lesions or debris and tympanic membranes are normal bilaterally with good movement to insufflation. GROSS HEARING: Normal  TMJ:  Nontender  INTRANASAL EXAM: No polyps or purulence.  NASOPHARYNX: Normal, without lesions. LIPS, TEETH & GUMS: No lip lesions, normal dentition and normal gums. ORAL CAVITY/OROPHARYNX:  Oral mucosa moist without lesion or asymmetry of the palate, tongue, tonsil or posterior pharynx. LARYNX (mirror exam):  No lesions of the epiglottis, false cord or TVC's and cords move well to phonation. HYPOPHARYNX (mirror exam): No lesions, asymmetry or pooling of secretions. NECK:  Supple without adenopathy or mass. Very thin neck but no palpable abnormalities. THYROID:  Normal with no masses palpable.  NEUROLOGIC:  No gross CN deficits. No nystagmus noted.   LYMPHATIC:  No enlarged nodes palpable. Signature  Electronically signed by : Izora Gala  M.D.; 07/12/2015 3:09 PM EST.

## 2015-07-21 ENCOUNTER — Encounter (HOSPITAL_COMMUNITY)
Admission: RE | Admit: 2015-07-21 | Discharge: 2015-07-21 | Disposition: A | Discharge status: Home / Self Care | Payer: BLUE CROSS/BLUE SHIELD | Source: Ambulatory Visit | Attending: Otolaryngology | Admitting: Otolaryngology

## 2015-07-21 ENCOUNTER — Encounter (HOSPITAL_COMMUNITY)
Admission: RE | Admit: 2015-07-21 | Discharge: 2015-07-21 | Disposition: A | Discharge status: Home / Self Care | Payer: BLUE CROSS/BLUE SHIELD | Source: Ambulatory Visit | Attending: Anesthesiology | Admitting: Anesthesiology

## 2015-07-21 ENCOUNTER — Encounter (HOSPITAL_COMMUNITY): Payer: Self-pay

## 2015-07-21 DIAGNOSIS — L4050 Arthropathic psoriasis, unspecified: Secondary | ICD-10-CM | POA: Diagnosis not present

## 2015-07-21 DIAGNOSIS — Z01818 Encounter for other preprocedural examination: Secondary | ICD-10-CM

## 2015-07-21 DIAGNOSIS — C73 Malignant neoplasm of thyroid gland: Secondary | ICD-10-CM | POA: Diagnosis present

## 2015-07-21 DIAGNOSIS — Z87891 Personal history of nicotine dependence: Secondary | ICD-10-CM | POA: Diagnosis not present

## 2015-07-21 DIAGNOSIS — E0590 Thyrotoxicosis, unspecified without thyrotoxic crisis or storm: Secondary | ICD-10-CM | POA: Diagnosis not present

## 2015-07-21 HISTORY — DX: Thyrotoxicosis, unspecified without thyrotoxic crisis or storm: E05.90

## 2015-07-21 LAB — BASIC METABOLIC PANEL
Anion gap: 10 (ref 5–15)
BUN: 9 mg/dL (ref 6–20)
CHLORIDE: 103 mmol/L (ref 101–111)
CO2: 23 mmol/L (ref 22–32)
CREATININE: 0.5 mg/dL (ref 0.44–1.00)
Calcium: 8.9 mg/dL (ref 8.9–10.3)
GFR calc Af Amer: >60 mL/min (ref >60)
GFR calc non Af Amer: >60 mL/min (ref >60)
GLUCOSE: 86 mg/dL (ref 65–99)
POTASSIUM: 3.8 mmol/L (ref 3.5–5.1)
SODIUM: 136 mmol/L (ref 135–145)

## 2015-07-21 LAB — CBC
HEMATOCRIT: 38.9 % (ref 36.0–46.0)
HEMOGLOBIN: 13.6 g/dL (ref 12.0–15.0)
MCH: 30.7 pg (ref 26.0–34.0)
MCHC: 35 g/dL (ref 30.0–36.0)
MCV: 87.8 fL (ref 78.0–100.0)
Platelets: 292 10*3/uL (ref 150–400)
RBC: 4.43 MIL/uL (ref 3.87–5.11)
RDW: 11.7 % (ref 11.5–15.5)
WBC: 7.5 10*3/uL (ref 4.0–10.5)

## 2015-07-21 NOTE — Progress Notes (Addendum)
PCP: Dr. Noah Delaine at [facility]: Dr. Chalmers Cater at [facility]  Cardiologist: Pt. States she saw him for a one time visit. Test: ekg,stress-will request.

## 2015-07-21 NOTE — Pre-Procedure Instructions (Signed)
    Rhonda Deleon  07/21/2015      [name] [address] [place], [place] [address] Phone: [phone] Fax: [phone]    Your procedure is scheduled on Friday, Oct. 28  Report to [facility] at 5:30 A.M.  Call this number if you have problems the morning of surgery:  [phone]   Remember:  Do not eat food or drink liquids after midnight on Thurs. Oct. 27  Take these medicines the morning of surgery with A SIP OF WATER : flovent-bring to hospital, hydrocodone or tramadol if needed, metoprolol, ventolin inhaler if needed-bring to hospital, tapazole             Stop vitamins, herbal medicines, advil, motrin,ibuprofen today.   Do not wear jewelry, make-up or nail polish.  Do not wear lotions, powders, or perfumes.  You may wear deodorant.  Do not shave 48 hours prior to surgery.  Men may shave face and neck.  Do not bring valuables to the hospital.  [facility] is not responsible for any belongings or valuables.  Contacts, dentures or bridgework may not be worn into surgery.  Leave your suitcase in the car.  After surgery it may be brought to your room.  For patients admitted to the hospital, discharge time will be determined by your treatment team.  Patients discharged the day of surgery will not be allowed to drive home.   Name and phone number of your driver:   Special instructions: preparing for surgery  Please read over the following fact sheets that you were given. Pain Booklet, Coughing and Deep Breathing and Surgical Site Infection Prevention

## 2015-07-22 MED ORDER — CEFAZOLIN SODIUM-DEXTROSE 2-3 GM-% IV SOLR
2.0000 g | INTRAVENOUS | Status: AC
  Administered 2015-07-23: 2 g via INTRAVENOUS
  Filled 2015-07-22: qty 50

## 2015-07-23 ENCOUNTER — Encounter (HOSPITAL_COMMUNITY): Payer: Self-pay | Admitting: *Deleted

## 2015-07-23 ENCOUNTER — Observation Stay (HOSPITAL_COMMUNITY)
Admission: RE | Admit: 2015-07-23 | Discharge: 2015-07-24 | Disposition: A | Discharge status: Home / Self Care | Payer: BLUE CROSS/BLUE SHIELD | Source: Ambulatory Visit | Attending: Otolaryngology | Admitting: Otolaryngology

## 2015-07-23 ENCOUNTER — Ambulatory Visit (HOSPITAL_COMMUNITY): Payer: BLUE CROSS/BLUE SHIELD | Admitting: Anesthesiology

## 2015-07-23 ENCOUNTER — Encounter (HOSPITAL_COMMUNITY)
Admission: RE | Disposition: A | Discharge status: Home / Self Care | Payer: Self-pay | Source: Ambulatory Visit | Attending: Otolaryngology

## 2015-07-23 DIAGNOSIS — C73 Malignant neoplasm of thyroid gland: Secondary | ICD-10-CM | POA: Diagnosis not present

## 2015-07-23 DIAGNOSIS — L4050 Arthropathic psoriasis, unspecified: Secondary | ICD-10-CM | POA: Insufficient documentation

## 2015-07-23 DIAGNOSIS — Z419 Encounter for procedure for purposes other than remedying health state, unspecified: Secondary | ICD-10-CM

## 2015-07-23 DIAGNOSIS — Z87891 Personal history of nicotine dependence: Secondary | ICD-10-CM | POA: Insufficient documentation

## 2015-07-23 DIAGNOSIS — E0590 Thyrotoxicosis, unspecified without thyrotoxic crisis or storm: Secondary | ICD-10-CM | POA: Insufficient documentation

## 2015-07-23 HISTORY — PX: THYROIDECTOMY: SHX17

## 2015-07-23 HISTORY — DX: Malignant neoplasm of thyroid gland: C73

## 2015-07-23 LAB — CALCIUM
Calcium: 8.8 mg/dL — ABNORMAL LOW (ref 8.9–10.3)
Calcium: 9 mg/dL (ref 8.9–10.3)

## 2015-07-23 SURGERY — THYROIDECTOMY
Anesthesia: General | Site: Neck | Laterality: Bilateral

## 2015-07-23 MED ORDER — GABAPENTIN 300 MG PO CAPS
300.0000 mg | ORAL_CAPSULE | Freq: Every day | ORAL | Status: DC
  Administered 2015-07-23: 300 mg via ORAL
  Filled 2015-07-23: qty 1

## 2015-07-23 MED ORDER — LACTATED RINGERS IV SOLN
INTRAVENOUS | Status: DC | PRN
  Administered 2015-07-23: 07:00:00 via INTRAVENOUS

## 2015-07-23 MED ORDER — TRAMADOL HCL 50 MG PO TABS: 50.0000 mg | ORAL_TABLET | Freq: Four times a day (QID) | ORAL | Status: DC | PRN

## 2015-07-23 MED ORDER — BUDESONIDE 0.25 MG/2ML IN SUSP
0.2500 mg | Freq: Two times a day (BID) | RESPIRATORY_TRACT | Status: DC
  Administered 2015-07-23 – 2015-07-24 (×3): 0.25 mg via RESPIRATORY_TRACT
  Filled 2015-07-23 (×2): qty 2

## 2015-07-23 MED ORDER — ALBUTEROL SULFATE (2.5 MG/3ML) 0.083% IN NEBU: 2.5000 mg | INHALATION_SOLUTION | Freq: Four times a day (QID) | RESPIRATORY_TRACT | Status: DC | PRN

## 2015-07-23 MED ORDER — SUMATRIPTAN SUCCINATE 100 MG PO TABS
100.0000 mg | ORAL_TABLET | ORAL | Status: DC | PRN
Start: 2015-07-23 — End: 2015-07-24
  Filled 2015-07-23: qty 1

## 2015-07-23 MED ORDER — LEVOTHYROXINE SODIUM 100 MCG PO TABS
100.0000 ug | ORAL_TABLET | Freq: Every day | ORAL | Status: DC
  Administered 2015-07-24: 100 ug via ORAL
  Filled 2015-07-23: qty 1

## 2015-07-23 MED ORDER — FENTANYL CITRATE (PF) 250 MCG/5ML IJ SOLN
INTRAMUSCULAR | Status: AC
  Filled 2015-07-23: qty 5

## 2015-07-23 MED ORDER — SUCCINYLCHOLINE 20MG/ML (10ML) SYRINGE FOR MEDFUSION PUMP - OPTIME
INTRAMUSCULAR | Status: DC | PRN
  Administered 2015-07-23: 100 mg via INTRAVENOUS

## 2015-07-23 MED ORDER — ALUM & MAG HYDROXIDE-SIMETH 200-200-20 MG/5ML PO SUSP
30.0000 mL | ORAL | Status: DC | PRN
  Administered 2015-07-23: 30 mL via ORAL
  Filled 2015-07-23: qty 30

## 2015-07-23 MED ORDER — LIDOCAINE HCL (CARDIAC) 20 MG/ML IV SOLN
INTRAVENOUS | Status: DC | PRN
  Administered 2015-07-23: 100 mg via INTRAVENOUS

## 2015-07-23 MED ORDER — GLYCOPYRROLATE 0.2 MG/ML IJ SOLN
INTRAMUSCULAR | Status: DC | PRN
  Administered 2015-07-23: 0.4 mg via INTRAVENOUS

## 2015-07-23 MED ORDER — DEXAMETHASONE SODIUM PHOSPHATE 10 MG/ML IJ SOLN
INTRAMUSCULAR | Status: AC
  Filled 2015-07-23: qty 1

## 2015-07-23 MED ORDER — NEOSTIGMINE METHYLSULFATE 10 MG/10ML IV SOLN
INTRAVENOUS | Status: AC
  Filled 2015-07-23: qty 1

## 2015-07-23 MED ORDER — NEOSTIGMINE METHYLSULFATE 10 MG/10ML IV SOLN
INTRAVENOUS | Status: DC | PRN
  Administered 2015-07-23: 3 mg via INTRAVENOUS

## 2015-07-23 MED ORDER — ONDANSETRON HCL 4 MG/2ML IJ SOLN: 4.0000 mg | Freq: Once | INTRAMUSCULAR | Status: DC | PRN

## 2015-07-23 MED ORDER — GLYCOPYRROLATE 0.2 MG/ML IJ SOLN
INTRAMUSCULAR | Status: AC
  Filled 2015-07-23: qty 3

## 2015-07-23 MED ORDER — FENTANYL CITRATE (PF) 100 MCG/2ML IJ SOLN
INTRAMUSCULAR | Status: AC
  Filled 2015-07-23: qty 2

## 2015-07-23 MED ORDER — ROCURONIUM BROMIDE 50 MG/5ML IV SOLN
INTRAVENOUS | Status: AC
  Filled 2015-07-23: qty 1

## 2015-07-23 MED ORDER — BETAMETHASONE VALERATE 0.1 % EX LOTN
1.0000 | TOPICAL_LOTION | Freq: Every day | CUTANEOUS | Status: DC
Start: 2015-07-24 — End: 2015-07-23
  Filled 2015-07-23: qty 60

## 2015-07-23 MED ORDER — FENTANYL CITRATE (PF) 100 MCG/2ML IJ SOLN
INTRAMUSCULAR | Status: DC | PRN
  Administered 2015-07-23 (×5): 50 ug via INTRAVENOUS

## 2015-07-23 MED ORDER — ROCURONIUM BROMIDE 100 MG/10ML IV SOLN
INTRAVENOUS | Status: DC | PRN
  Administered 2015-07-23: 30 mg via INTRAVENOUS

## 2015-07-23 MED ORDER — POLYETHYLENE GLYCOL 3350 17 G PO PACK: 17.0000 g | PACK | Freq: Every day | ORAL | Status: DC

## 2015-07-23 MED ORDER — LEVOTHYROXINE SODIUM 100 MCG PO TABS: 100.0000 ug | ORAL_TABLET | Freq: Every day | ORAL | Status: DC

## 2015-07-23 MED ORDER — METOPROLOL SUCCINATE ER 50 MG PO TB24: 50.0000 mg | ORAL_TABLET | Freq: Every day | ORAL | Status: DC

## 2015-07-23 MED ORDER — HYDROCODONE-ACETAMINOPHEN 5-325 MG PO TABS
1.0000 | ORAL_TABLET | Freq: Four times a day (QID) | ORAL | Status: DC | PRN
  Administered 2015-07-23 – 2015-07-24 (×3): 2 via ORAL
  Filled 2015-07-23 (×2): qty 2

## 2015-07-23 MED ORDER — 0.9 % SODIUM CHLORIDE (POUR BTL) OPTIME
TOPICAL | Status: DC | PRN
  Administered 2015-07-23: 1000 mL

## 2015-07-23 MED ORDER — HYDROCODONE-ACETAMINOPHEN 5-325 MG PO TABS
ORAL_TABLET | ORAL | Status: AC
  Filled 2015-07-23: qty 2

## 2015-07-23 MED ORDER — SECUKINUMAB 150 MG/ML ~~LOC~~ SOSY: 300.0000 mg | PREFILLED_SYRINGE | SUBCUTANEOUS | Status: DC

## 2015-07-23 MED ORDER — FENTANYL CITRATE (PF) 100 MCG/2ML IJ SOLN
25.0000 ug | INTRAMUSCULAR | Status: DC | PRN
  Administered 2015-07-23 (×2): 50 ug via INTRAVENOUS

## 2015-07-23 MED ORDER — DEXAMETHASONE SODIUM PHOSPHATE 10 MG/ML IJ SOLN
INTRAMUSCULAR | Status: DC | PRN
  Administered 2015-07-23: 10 mg via INTRAVENOUS

## 2015-07-23 MED ORDER — PROPOFOL 10 MG/ML IV BOLUS
INTRAVENOUS | Status: AC
  Filled 2015-07-23: qty 20

## 2015-07-23 MED ORDER — MIDAZOLAM HCL 5 MG/5ML IJ SOLN
INTRAMUSCULAR | Status: DC | PRN
  Administered 2015-07-23: 2 mg via INTRAVENOUS

## 2015-07-23 MED ORDER — MIDAZOLAM HCL 2 MG/2ML IJ SOLN
INTRAMUSCULAR | Status: AC
  Filled 2015-07-23: qty 4

## 2015-07-23 MED ORDER — ONDANSETRON HCL 4 MG/2ML IJ SOLN
INTRAMUSCULAR | Status: DC | PRN
  Administered 2015-07-23: 4 mg via INTRAVENOUS

## 2015-07-23 MED ORDER — ALPRAZOLAM 0.5 MG PO TABS
1.0000 mg | ORAL_TABLET | Freq: Every day | ORAL | Status: DC
  Administered 2015-07-23: 1 mg via ORAL
  Filled 2015-07-23: qty 2

## 2015-07-23 MED ORDER — SUCCINYLCHOLINE CHLORIDE 20 MG/ML IJ SOLN
INTRAMUSCULAR | Status: AC
  Filled 2015-07-23: qty 1

## 2015-07-23 SURGICAL SUPPLY — 37 items
ADH SKN CLS APL DERMABOND .7 (GAUZE/BANDAGES/DRESSINGS) ×1
BLADE SURG 15 STRL LF DISP TIS (BLADE) IMPLANT
BLADE SURG 15 STRL SS (BLADE)
CANISTER SUCTION 2500CC (MISCELLANEOUS) ×2 IMPLANT
CLEANER TIP ELECTROSURG 2X2 (MISCELLANEOUS) ×2 IMPLANT
CONT SPEC 4OZ CLIKSEAL STRL BL (MISCELLANEOUS) ×2 IMPLANT
CORDS BIPOLAR (ELECTRODE) ×2 IMPLANT
COVER SURGICAL LIGHT HANDLE (MISCELLANEOUS) ×2 IMPLANT
DERMABOND ADVANCED (GAUZE/BANDAGES/DRESSINGS) ×1
DERMABOND ADVANCED .7 DNX12 (GAUZE/BANDAGES/DRESSINGS) ×1 IMPLANT
DRAIN HEMOVAC 7FR (DRAIN) IMPLANT
DRAIN SNY 10 ROU (WOUND CARE) IMPLANT
DRAPE PROXIMA HALF (DRAPES) IMPLANT
ELECT COATED BLADE 2.86 ST (ELECTRODE) ×2 IMPLANT
ELECT REM PT RETURN 9FT ADLT (ELECTROSURGICAL) ×2
ELECTRODE REM PT RTRN 9FT ADLT (ELECTROSURGICAL) ×1 IMPLANT
EVACUATOR SILICONE 100CC (DRAIN) ×2 IMPLANT
FORCEPS BIPOLAR SPETZLER 8 1.0 (NEUROSURGERY SUPPLIES) ×2 IMPLANT
GAUZE SPONGE 4X4 16PLY XRAY LF (GAUZE/BANDAGES/DRESSINGS) IMPLANT
GLOVE BIO SURGEON STRL SZ 6.5 (GLOVE) IMPLANT
GLOVE ECLIPSE 7.5 STRL STRAW (GLOVE) ×2 IMPLANT
GOWN STRL REUS W/ TWL LRG LVL3 (GOWN DISPOSABLE) ×2 IMPLANT
GOWN STRL REUS W/TWL LRG LVL3 (GOWN DISPOSABLE) ×4
KIT BASIN OR (CUSTOM PROCEDURE TRAY) ×2 IMPLANT
KIT ROOM TURNOVER OR (KITS) ×2 IMPLANT
NEEDLE 27GAX1X1/2 (NEEDLE) IMPLANT
NS IRRIG 1000ML POUR BTL (IV SOLUTION) ×2 IMPLANT
PAD ARMBOARD 7.5X6 YLW CONV (MISCELLANEOUS) ×4 IMPLANT
PENCIL FOOT CONTROL (ELECTRODE) ×2 IMPLANT
SHEARS HARMONIC 9CM CVD (BLADE) ×2 IMPLANT
STAPLER VISISTAT 35W (STAPLE) ×2 IMPLANT
SUT CHROMIC 4 0 PS 2 18 (SUTURE) ×4 IMPLANT
SUT ETHILON 3 0 PS 1 (SUTURE) ×2 IMPLANT
SUT SILK 3 0 REEL (SUTURE) IMPLANT
SUT SILK 4 0 REEL (SUTURE) ×2 IMPLANT
TOWEL OR 17X24 6PK STRL BLUE (TOWEL DISPOSABLE) ×2 IMPLANT
TRAY ENT MC OR (CUSTOM PROCEDURE TRAY) ×2 IMPLANT

## 2015-07-23 NOTE — Anesthesia Preprocedure Evaluation (Addendum)
Anesthesia Evaluation  Patient identified by MRN, date of birth, ID band Patient awake    Reviewed: Allergy & Precautions, NPO status , Patient's Chart, lab work & pertinent test results  History of Anesthesia Complications Negative for: history of anesthetic complications  Airway Mallampati: II  TM Distance: >3 FB Neck ROM: Full    Dental no notable dental hx. 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-takes trileptal to control  Neuromuscular disease negative psych ROS   GI/Hepatic negative GI ROS, Neg liver ROS,   Endo/Other  Hyperthyroidism   Renal/GU negative Renal ROS  negative genitourinary   Musculoskeletal  (+) Arthritis ,   Abdominal   Peds negative pediatric ROS (+)  Hematology negative hematology ROS (+)   Anesthesia Other Findings   Reproductive/Obstetrics negative OB ROS                            Anesthesia Physical Anesthesia Plan  ASA: II  Anesthesia Plan: General   Post-op Pain Management:    Induction: Intravenous  Airway Management Planned: Oral ETT  Additional Equipment:   Intra-op Plan:   Post-operative Plan: Extubation in OR  Informed Consent: I have reviewed the patients History and Physical, chart, labs and discussed the procedure including the risks, benefits and alternatives for the proposed anesthesia with the patient or authorized representative who has indicated his/her understanding and acceptance.   Dental advisory given  Plan Discussed with: CRNA  Anesthesia Plan Comments:         Anesthesia Quick Evaluation

## 2015-07-23 NOTE — Anesthesia Postprocedure Evaluation (Signed)
  Anesthesia Post-op Note  Patient: Rhonda Deleon  Procedure(s) Performed: Procedure(s) (LRB): BILATERAL TOTAL THYROIDECTOMY (Bilateral)  Patient Location: PACU  Anesthesia Type: General  Level of Consciousness: awake and alert   Airway and Oxygen Therapy: Patient Spontanous Breathing  Post-op Pain: mild  Post-op Assessment: Post-op Vital signs reviewed, Patient's Cardiovascular Status Stable, Respiratory Function Stable, Patent Airway and No signs of Nausea or vomiting  Last Vitals:  Filed Vitals:   07/23/15 0945  BP: 125/66  Pulse: 65  Temp:   Resp: 19    Post-op Vital Signs: stable   Complications: No apparent anesthesia complications

## 2015-07-23 NOTE — Op Note (Signed)
OPERATIVE REPORT  DATE OF SURGERY: 07/23/2015  PATIENT:  Rhonda Deleon,  49 y.o. female  PRE-OPERATIVE DIAGNOSIS:  PAPILLARY THROID CANCER  POST-OPERATIVE DIAGNOSIS:  PAPILLARY THROID CANCER  PROCEDURE:  Procedure(s): BILATERAL TOTAL THYROIDECTOMY  SURGEON:  Beckie Salts, MD  ASSISTANTS: Jodi Marble, MD  ANESTHESIA:   General   EBL:  20 ml  DRAINS: 7 French round J-P  LOCAL MEDICATIONS USED:  None  SPECIMEN:  Total thyroidectomy, suture marks left lobe  COUNTS:  Correct  PROCEDURE DETAILS: The patient was taken to the operating room and placed on the operating table in the supine position. A shoulder roll was placed beneath the shoulder blades and the neck was extended. The neck was prepped and draped in a standard fashion. A low collar transverse incision was outlined marking pen and was incised with electrocautery. Dissection was continued down through the platysma layer. A [place] or retractor was used throughout the case. The midline fascia was divided. The isthmus of the thyroid was identified. The left lobe was dissected first. The superior vasculature was identified, and divided using the harmonic dissector. A parathyroid was identified and preserved with its blood supply. The recurrent nerve was identified and preserved. The middle thyroid vein was divided using the harmonic dissector. The inferior vasculature was treated in a similar fashion. There is ligament was divided using electrocautery and the lobe was marked with a suture and dissected off the trachea.  The right lobe was dissected in an identical fashion. The left lobe contained about a 1 cm firm nodule. There were no palpable nodules on the right. There is no palpable adenopathy in the surrounding soft tissue. A parathyroid was identified on the right side and preserved as well as was the nerve. This was sent for pathologic evaluation. The wound was irrigated with saline. Bipolar cautery was used as needed  for completion of hemostasis. The drain was secured through a separate stab incision inferior to the center of the incision and secured in place with a nylon suture. The midline fascia was reapproximated with chromic suture. Platysma was reapproximated as well with interrupted chromic and a running subcuticular chromic closure was accomplished. Dermabond was used on the skin. The drain was charged. The patient was awakened extubated and transferred to recovery in stable condition.Marland Kitchen     PATIENT DISPOSITION:  To PACU, stable

## 2015-07-23 NOTE — Interval H&P Note (Signed)
History and Physical Interval Note:  07/23/2015 7:15 AM  Rhonda Deleon  has presented today for surgery, with the diagnosis of PAPILLARY THROID CANCER  The various methods of treatment have been discussed with the patient and family. After consideration of risks, benefits and other options for treatment, the patient has consented to  Procedure(s): BILATERAL TOTAL THYROIDECTOMY (Bilateral) as a surgical intervention .  The patient's history has been reviewed, patient examined, no change in status, stable for surgery.  I have reviewed the patient's chart and labs.  Questions were answered to the patient's satisfaction.     Naftuli Dalsanto

## 2015-07-23 NOTE — Transfer of Care (Signed)
Immediate Anesthesia Transfer of Care Note  Patient: Rhonda Deleon  Procedure(s) Performed: Procedure(s): BILATERAL TOTAL THYROIDECTOMY (Bilateral)  Patient Location: PACU  Anesthesia Type:General  Level of Consciousness: awake, alert , patient cooperative and responds to stimulation  Airway & Oxygen Therapy: Patient Spontanous Breathing and Patient connected to face mask oxygen  Post-op Assessment: Report given to RN, Post -op Vital signs reviewed and stable and Patient moving all extremities X 4  Post vital signs: Reviewed and stable  Last Vitals:  Filed Vitals:   07/23/15 0909  BP: 115/65  Pulse: 87  Temp: 36.6 C  Resp: 15    Complications: No apparent anesthesia complications

## 2015-07-23 NOTE — Anesthesia Procedure Notes (Signed)
Procedure Name: Intubation Date/Time: 07/23/2015 7:41 AM Performed by: Gershon Mussel, Torry Istre Pre-anesthesia Checklist: Patient identified, Patient being monitored, Timeout performed, Emergency Drugs available and Suction available Patient Re-evaluated:Patient Re-evaluated prior to inductionOxygen Delivery Method: Circle System Utilized Preoxygenation: Pre-oxygenation with 100% oxygen Intubation Type: IV induction Ventilation: Mask ventilation without difficulty Laryngoscope Size: Miller and 2 Grade View: Grade I Tube type: Oral Tube size: 7.0 mm Number of attempts: 1 Airway Equipment and Method: Stylet Placement Confirmation: ETT inserted through vocal cords under direct vision,  positive ETCO2 and breath sounds checked- equal and bilateral Secured at: 21 cm Tube secured with: Tape Dental Injury: Teeth and Oropharynx as per pre-operative assessment

## 2015-07-23 NOTE — H&P (View-Only) (Signed)
  Assessment  Hyperthyroidism (242.90) (E05.90). Psoriatic arthritis (696.0) (L40.50). Papillary thyroid carcinoma (193) (C73). Discussed  Hyperthyroidism with an associated 1.1 cm papillary carcinoma. Recommend total thyroidectomy. Risks and benefits were discussed in detail. Complications were discussed including hypocalcemia and recurrent nerve injury. All questions were answered. A handout was provided. We will schedule at her convenience. Reason For Visit  Rhonda Deleon is here today at the kind request of Balan, Bindubal for consultation and opinion for thyroid cancer. HPI  History of hyperthyroidism with tachycardia and palpitations. Workup revealed hyperthyroidism and a thyroid nodule, 1.1 cm on the left. FNA revealed papillary cancer. Allergies  Adhesive Tape Hibiclens. Current Meds  MethIMAzole 5 MG Oral Tablet;; RPT TraMADol HCl - 50 MG Oral Tablet;; RPT Ventolin HFA 108 (90 Base) MCG/ACT Inhalation Aerosol Solution;; RPT Gabapentin 100 MG Oral Capsule;; RPT Metoprolol Succinate ER 50 MG Oral Tablet Extended Release 24 Hour;; RPT Cosentyx 150 MG/ML Subcutaneous Solution Prefilled Syringe;; RPT. Active Problems  Arthritis   (716.90) (M19.90) Hyperthyroidism   (242.90) (E05.90) Psoriatic arthritis   (696.0) (L40.50). [place]  Breast Surgery Lumpectomy Breast Surgery Partial Mastectomy Oral Surgery Tooth Extraction. Family Hx  Family history of Alzheimer's disease (V17.2) (Z82.0) Family history of breast cancer (V16.3) (Z80.3) Family history of rheumatoid arthritis (V17.7) (Z82.61). Personal Hx  Alcohol use (V49.89) (Z78.9); 2 drinks day or fewer Caffeine use (V49.89) (F15.90); 2 cups daily Former smoker 1995 [phone]) [phone]). ROS  12 system ROS was obtained and reviewed on the Health Maintenance form dated today.  Positive responses are shown above.  If the symptom is not checked, the patient has denied it. Vital Signs   Recorded by Iran Sizer on 12 Jul 2015  02:52 PM BP:102/60,  BSA Calculated: 1.49 ,  BMI Calculated: 18.66 ,  Weight: 107 lb , BMI: 18.7 kg/m2,  Height: 5 ft 3.5 in. Physical Exam  APPEARANCE: Well developed, well nourished, in no acute distress.  Normal affect, in a pleasant mood.  Oriented to time, place and person. COMMUNICATION: Normal voice   HEAD & FACE:  No scars, lesions or masses of head and face.  Sinuses nontender to palpation.  Salivary glands without mass or tenderness.  Facial strength symmetric.  No facial lesion, scars, or mass. EYES: EOMI with normal primary gaze alignment. Visual acuity grossly intact.  PERRLA EXTERNAL EAR & NOSE: No scars, lesions or masses  EAC & TYMPANIC MEMBRANE:  EAC shows no obstructing lesions or debris and tympanic membranes are normal bilaterally with good movement to insufflation. GROSS HEARING: Normal  TMJ:  Nontender  INTRANASAL EXAM: No polyps or purulence.  NASOPHARYNX: Normal, without lesions. LIPS, TEETH & GUMS: No lip lesions, normal dentition and normal gums. ORAL CAVITY/OROPHARYNX:  Oral mucosa moist without lesion or asymmetry of the palate, tongue, tonsil or posterior pharynx. LARYNX (mirror exam):  No lesions of the epiglottis, false cord or TVC's and cords move well to phonation. HYPOPHARYNX (mirror exam): No lesions, asymmetry or pooling of secretions. NECK:  Supple without adenopathy or mass. Very thin neck but no palpable abnormalities. THYROID:  Normal with no masses palpable.  NEUROLOGIC:  No gross CN deficits. No nystagmus noted.   LYMPHATIC:  No enlarged nodes palpable. Signature  Electronically signed by : Izora Gala  M.D.; 07/12/2015 3:09 PM EST.

## 2015-07-23 NOTE — Discharge Instructions (Signed)
It is okay to shower and use soap and water on the incision but do not use any creams, oils or ointment.

## 2015-07-24 DIAGNOSIS — C73 Malignant neoplasm of thyroid gland: Secondary | ICD-10-CM | POA: Diagnosis not present

## 2015-07-24 LAB — CALCIUM: CALCIUM: 8.9 mg/dL (ref 8.9–10.3)

## 2015-07-24 NOTE — Discharge Summary (Signed)
  Physician Discharge Summary  Patient ID: Rhonda Deleon MRN: [id number] DOB/AGE: [birth year] 47 y.o.  Admit date: 07/23/2015 Discharge date: 07/24/2015  Admission Diagnoses:Papillary thyroid cancer  Discharge Diagnoses:  Active Problems:   Surgery, elective   Discharged Condition: good  Hospital Course: no complications  Consults: none  Significant Diagnostic Studies: none  Treatments: surgery: total thyroidectomy  Discharge Exam: Blood pressure 102/45, pulse 86, temperature 98.6 F (37 C), temperature source Oral, resp. rate 19, height 5\' 3"  (1.6 m), weight 48.535 kg (107 lb), last menstrual period 11/18/2011, SpO2 98 %. PHYSICAL EXAM: Voice normal, neck excellent. JP removed.  Disposition: 01-Home or Self Care     Medication List    STOP taking these medications        metoprolol succinate 50 MG 24 hr tablet  Commonly known as:  TOPROL-XL      TAKE these medications        ALPRAZolam 1 MG tablet  Commonly known as:  XANAX  Take 1 mg by mouth at bedtime.     Betamethasone Valerate 0.12 % foam  Apply 1 application topically daily.     COSENTYX 150 MG/ML Sosy  Generic drug:  Secukinumab  Inject 300 mg into the skin every 30 (thirty) days.     FLOVENT HFA 110 MCG/ACT inhaler  Generic drug:  fluticasone  Inhale 2 puffs into the lungs as needed (SOB).     gabapentin 100 MG capsule  Commonly known as:  NEURONTIN  Take 300 mg by mouth at bedtime.     HYDROcodone-acetaminophen 5-325 MG tablet  Commonly known as:  NORCO  Take 1-2 tablets by mouth every 6 (six) hours as needed for moderate pain or severe pain.     levothyroxine 100 MCG tablet  Commonly known as:  SYNTHROID  Take 1 tablet (100 mcg total) by mouth daily.     polyethylene glycol packet  Commonly known as:  MIRALAX / GLYCOLAX  Take 17 g by mouth daily.     SUMAtriptan 100 MG tablet  Commonly known as:  IMITREX  Take 1 tablet (100 mg total) by mouth as needed for migraine. May repeat  in 2 hours if headache persists or recurs.     traMADol 50 MG tablet  Commonly known as:  ULTRAM  Take 1 tablet (50 mg total) by mouth every 6 (six) hours as needed for pain.     VENTOLIN HFA 108 (90 BASE) MCG/ACT inhaler  Generic drug:  albuterol  Inhale 1-2 puffs into the lungs every 6 (six) hours as needed for wheezing or shortness of breath.           Follow-up Information    Follow up with Izora Gala, MD. Schedule an appointment as soon as possible for a visit in 1 week.   Specialty:  Otolaryngology   Contact information:   [address] [place] [address] [phone]       Signed: Izora Gala 07/24/2015, 9:00 AM

## 2015-07-26 ENCOUNTER — Encounter (HOSPITAL_COMMUNITY): Payer: Self-pay | Admitting: Otolaryngology

## 2016-03-07 DIAGNOSIS — L4050 Arthropathic psoriasis, unspecified: Secondary | ICD-10-CM | POA: Diagnosis not present

## 2016-03-07 DIAGNOSIS — Z79899 Other long term (current) drug therapy: Secondary | ICD-10-CM | POA: Diagnosis not present

## 2016-04-06 DIAGNOSIS — Z79899 Other long term (current) drug therapy: Secondary | ICD-10-CM | POA: Diagnosis not present

## 2016-04-06 DIAGNOSIS — L4050 Arthropathic psoriasis, unspecified: Secondary | ICD-10-CM | POA: Diagnosis not present

## 2016-06-07 DIAGNOSIS — L4050 Arthropathic psoriasis, unspecified: Secondary | ICD-10-CM | POA: Diagnosis not present

## 2016-06-07 DIAGNOSIS — Z79899 Other long term (current) drug therapy: Secondary | ICD-10-CM | POA: Diagnosis not present

## 2016-06-07 DIAGNOSIS — R208 Other disturbances of skin sensation: Secondary | ICD-10-CM | POA: Diagnosis not present

## 2016-07-05 DIAGNOSIS — R208 Other disturbances of skin sensation: Secondary | ICD-10-CM | POA: Diagnosis not present

## 2016-07-05 DIAGNOSIS — L4050 Arthropathic psoriasis, unspecified: Secondary | ICD-10-CM | POA: Diagnosis not present

## 2016-07-05 DIAGNOSIS — R110 Nausea: Secondary | ICD-10-CM | POA: Diagnosis not present

## 2016-07-05 DIAGNOSIS — Z79899 Other long term (current) drug therapy: Secondary | ICD-10-CM | POA: Diagnosis not present

## 2016-07-05 DIAGNOSIS — Z23 Encounter for immunization: Secondary | ICD-10-CM | POA: Diagnosis not present

## 2016-08-15 DIAGNOSIS — R5383 Other fatigue: Secondary | ICD-10-CM | POA: Diagnosis not present

## 2016-08-15 DIAGNOSIS — E039 Hypothyroidism, unspecified: Secondary | ICD-10-CM | POA: Diagnosis not present

## 2016-08-15 DIAGNOSIS — J209 Acute bronchitis, unspecified: Secondary | ICD-10-CM | POA: Diagnosis not present

## 2016-08-15 DIAGNOSIS — Z79899 Other long term (current) drug therapy: Secondary | ICD-10-CM | POA: Diagnosis not present

## 2016-09-05 DIAGNOSIS — L820 Inflamed seborrheic keratosis: Secondary | ICD-10-CM | POA: Diagnosis not present

## 2016-11-17 DIAGNOSIS — M12842 Other specific arthropathies, not elsewhere classified, left hand: Secondary | ICD-10-CM | POA: Diagnosis not present

## 2016-11-17 DIAGNOSIS — M12871 Other specific arthropathies, not elsewhere classified, right ankle and foot: Secondary | ICD-10-CM | POA: Diagnosis not present

## 2016-11-17 DIAGNOSIS — M12872 Other specific arthropathies, not elsewhere classified, left ankle and foot: Secondary | ICD-10-CM | POA: Diagnosis not present

## 2016-11-17 DIAGNOSIS — L4050 Arthropathic psoriasis, unspecified: Secondary | ICD-10-CM | POA: Diagnosis not present

## 2016-11-17 DIAGNOSIS — E890 Postprocedural hypothyroidism: Secondary | ICD-10-CM | POA: Diagnosis not present

## 2016-11-17 DIAGNOSIS — M12841 Other specific arthropathies, not elsewhere classified, right hand: Secondary | ICD-10-CM | POA: Diagnosis not present

## 2016-11-17 DIAGNOSIS — M859 Disorder of bone density and structure, unspecified: Secondary | ICD-10-CM | POA: Diagnosis not present

## 2016-12-15 DIAGNOSIS — Z79899 Other long term (current) drug therapy: Secondary | ICD-10-CM | POA: Diagnosis not present

## 2016-12-15 DIAGNOSIS — R110 Nausea: Secondary | ICD-10-CM | POA: Diagnosis not present

## 2016-12-15 DIAGNOSIS — L4050 Arthropathic psoriasis, unspecified: Secondary | ICD-10-CM | POA: Diagnosis not present

## 2016-12-15 DIAGNOSIS — R208 Other disturbances of skin sensation: Secondary | ICD-10-CM | POA: Diagnosis not present

## 2016-12-19 DIAGNOSIS — H66002 Acute suppurative otitis media without spontaneous rupture of ear drum, left ear: Secondary | ICD-10-CM | POA: Diagnosis not present

## 2016-12-26 DIAGNOSIS — E890 Postprocedural hypothyroidism: Secondary | ICD-10-CM | POA: Diagnosis not present

## 2017-01-02 DIAGNOSIS — E890 Postprocedural hypothyroidism: Secondary | ICD-10-CM | POA: Diagnosis not present

## 2017-01-02 DIAGNOSIS — C73 Malignant neoplasm of thyroid gland: Secondary | ICD-10-CM | POA: Diagnosis not present

## 2017-02-13 DIAGNOSIS — E890 Postprocedural hypothyroidism: Secondary | ICD-10-CM | POA: Diagnosis not present

## 2017-04-13 DIAGNOSIS — E039 Hypothyroidism, unspecified: Secondary | ICD-10-CM | POA: Diagnosis not present

## 2017-04-13 DIAGNOSIS — I10 Essential (primary) hypertension: Secondary | ICD-10-CM | POA: Diagnosis not present

## 2017-04-13 DIAGNOSIS — E559 Vitamin D deficiency, unspecified: Secondary | ICD-10-CM | POA: Diagnosis not present

## 2017-04-13 DIAGNOSIS — Z0001 Encounter for general adult medical examination with abnormal findings: Secondary | ICD-10-CM | POA: Diagnosis not present

## 2017-05-14 ENCOUNTER — Ambulatory Visit (INDEPENDENT_AMBULATORY_CARE_PROVIDER_SITE_OTHER): Payer: BLUE CROSS/BLUE SHIELD

## 2017-05-14 ENCOUNTER — Ambulatory Visit (INDEPENDENT_AMBULATORY_CARE_PROVIDER_SITE_OTHER): Payer: BLUE CROSS/BLUE SHIELD | Admitting: Podiatry

## 2017-05-14 ENCOUNTER — Encounter: Payer: Self-pay | Admitting: Podiatry

## 2017-05-14 VITALS — BP 158/69

## 2017-05-14 DIAGNOSIS — M722 Plantar fascial fibromatosis: Secondary | ICD-10-CM

## 2017-05-14 MED ORDER — TRIAMCINOLONE ACETONIDE 10 MG/ML IJ SUSP
10.0000 mg | Freq: Once | INTRAMUSCULAR | Status: AC
  Administered 2017-05-14: 10 mg

## 2017-05-14 NOTE — Progress Notes (Signed)
   Subjective:    Patient ID: Rhonda Deleon, female    DOB: 01/01/1966, 51 y.o.   MRN: [id number]  HPI  Chief Complaint  Patient presents with  . Foot Pain    Pt stated seen have history arthritis, B/L bottom, and back feet having burning/burning/redness sensation for 10 years     Review of Systems  Musculoskeletal: Positive for gait problem and joint swelling.       Objective:   Physical Exam        Assessment & Plan:

## 2017-05-14 NOTE — Progress Notes (Signed)
Subjective:    Patient ID: Rhonda Deleon, female   DOB: 51 y.o.   MRN: [id number]   HPI patient states she's had a lot of pain in the feet in general over the last 10 years and she's very active and hikes up to 12 miles and she states that they just burn at night and she think states she colors and turn red and one of her toes on her left at times turns white    Review of Systems  All other systems reviewed and are negative.       Objective:  Physical Exam  Constitutional: She appears well-developed and well-nourished.  Cardiovascular: Intact distal pulses.   Musculoskeletal: Normal range of motion.  Neurological: She is alert.  Skin: Skin is warm.  Nursing note and vitals reviewed.  neurovascular status intact muscle strength adequate range of motion within normal limits with patient found to have inflammation and pain around the plantar fascial left distal to the insertion of the calcaneus with no indications currently of Raynaud's phenomena and history of psoriatic arthritis with moderate depression of the arch     Assessment:   Difficult to say how much of this may be related to fasciitis and how much of it may be related to just generalized foot structure      Plan:    H&P condition reviewed and today I did review x-rays. I did a careful injection distal to the insertion of the calcaneus of the fascia 3 mg Kenalog 5 mill grams Xylocaine and applied fascial brace gave instructions on physical therapy and I went ahead and discussed long-term orthotics. Reappoint in the next 2 weeks to reevaluate and see the response we also discussed the vascular issues which may be part of this that may not be treatable  X-rays indicate that there is some significant depression of the arch but no indications of fracture or other bone pathology

## 2017-05-30 ENCOUNTER — Ambulatory Visit (INDEPENDENT_AMBULATORY_CARE_PROVIDER_SITE_OTHER): Payer: BLUE CROSS/BLUE SHIELD | Admitting: Podiatry

## 2017-05-30 ENCOUNTER — Encounter: Payer: Self-pay | Admitting: Podiatry

## 2017-05-30 DIAGNOSIS — M722 Plantar fascial fibromatosis: Secondary | ICD-10-CM

## 2017-05-30 NOTE — Progress Notes (Signed)
Subjective:    Patient ID: Rhonda Deleon, female   DOB: 51 y.o.   MRN: [id number]   HPIpatient presents statingHer left foot is feeling much better but she likes to be very active and she is worried about recurrence given a 10 year history of this problem    ROS      Objective:  Physical Exam neurovascular status intact with patient's left foot improving with diminished discomfort in the plantar fascia distal to its insertion into the calcaneus     Assessment:   Long-term history of plantar fasciitis left with improvement that's occurred     Plan:   H&P condition reviewed and today I went ahead and dispensed a AFO night brace with instructions on heat ice therapy and also scheduled for orthotics with risk. I have recommended a softer type device due to her intense activity levels her narrow heel and I do think a deep heel seat would be of benefit as she mostly wears tennis shoes and hiking boots

## 2017-06-05 ENCOUNTER — Other Ambulatory Visit: Payer: BLUE CROSS/BLUE SHIELD | Admitting: Orthotics

## 2017-12-18 DIAGNOSIS — E890 Postprocedural hypothyroidism: Secondary | ICD-10-CM | POA: Diagnosis not present

## 2017-12-18 DIAGNOSIS — I10 Essential (primary) hypertension: Secondary | ICD-10-CM | POA: Diagnosis not present

## 2018-01-02 DIAGNOSIS — E890 Postprocedural hypothyroidism: Secondary | ICD-10-CM | POA: Diagnosis not present

## 2018-01-02 DIAGNOSIS — I10 Essential (primary) hypertension: Secondary | ICD-10-CM | POA: Diagnosis not present

## 2018-01-02 DIAGNOSIS — C73 Malignant neoplasm of thyroid gland: Secondary | ICD-10-CM | POA: Diagnosis not present

## 2018-01-04 DIAGNOSIS — E890 Postprocedural hypothyroidism: Secondary | ICD-10-CM | POA: Diagnosis not present

## 2018-01-04 DIAGNOSIS — Z0000 Encounter for general adult medical examination without abnormal findings: Secondary | ICD-10-CM | POA: Diagnosis not present

## 2018-01-24 DIAGNOSIS — Z01818 Encounter for other preprocedural examination: Secondary | ICD-10-CM | POA: Diagnosis not present

## 2018-01-24 DIAGNOSIS — Z1211 Encounter for screening for malignant neoplasm of colon: Secondary | ICD-10-CM | POA: Diagnosis not present

## 2018-03-06 DIAGNOSIS — K635 Polyp of colon: Secondary | ICD-10-CM | POA: Diagnosis not present

## 2018-03-06 DIAGNOSIS — Z1211 Encounter for screening for malignant neoplasm of colon: Secondary | ICD-10-CM | POA: Diagnosis not present

## 2018-03-06 DIAGNOSIS — K5730 Diverticulosis of large intestine without perforation or abscess without bleeding: Secondary | ICD-10-CM | POA: Diagnosis not present

## 2018-03-08 DIAGNOSIS — Z1211 Encounter for screening for malignant neoplasm of colon: Secondary | ICD-10-CM | POA: Diagnosis not present

## 2018-03-08 DIAGNOSIS — K635 Polyp of colon: Secondary | ICD-10-CM | POA: Diagnosis not present

## 2018-03-08 DIAGNOSIS — E890 Postprocedural hypothyroidism: Secondary | ICD-10-CM | POA: Diagnosis not present

## 2018-04-25 DIAGNOSIS — E890 Postprocedural hypothyroidism: Secondary | ICD-10-CM | POA: Diagnosis not present

## 2018-04-25 DIAGNOSIS — C73 Malignant neoplasm of thyroid gland: Secondary | ICD-10-CM | POA: Diagnosis not present

## 2018-04-26 DIAGNOSIS — Z0001 Encounter for general adult medical examination with abnormal findings: Secondary | ICD-10-CM | POA: Diagnosis not present

## 2018-04-26 DIAGNOSIS — E0590 Thyrotoxicosis, unspecified without thyrotoxic crisis or storm: Secondary | ICD-10-CM | POA: Diagnosis not present

## 2018-04-26 DIAGNOSIS — I10 Essential (primary) hypertension: Secondary | ICD-10-CM | POA: Diagnosis not present

## 2018-04-26 DIAGNOSIS — E559 Vitamin D deficiency, unspecified: Secondary | ICD-10-CM | POA: Diagnosis not present

## 2018-04-30 DIAGNOSIS — E039 Hypothyroidism, unspecified: Secondary | ICD-10-CM | POA: Diagnosis not present

## 2018-04-30 DIAGNOSIS — R5383 Other fatigue: Secondary | ICD-10-CM | POA: Diagnosis not present

## 2018-04-30 DIAGNOSIS — E785 Hyperlipidemia, unspecified: Secondary | ICD-10-CM | POA: Diagnosis not present

## 2018-04-30 DIAGNOSIS — I10 Essential (primary) hypertension: Secondary | ICD-10-CM | POA: Diagnosis not present

## 2018-06-06 DIAGNOSIS — E890 Postprocedural hypothyroidism: Secondary | ICD-10-CM | POA: Diagnosis not present

## 2018-06-11 DIAGNOSIS — E890 Postprocedural hypothyroidism: Secondary | ICD-10-CM | POA: Diagnosis not present

## 2018-06-11 DIAGNOSIS — C73 Malignant neoplasm of thyroid gland: Secondary | ICD-10-CM | POA: Diagnosis not present

## 2018-06-11 DIAGNOSIS — Z23 Encounter for immunization: Secondary | ICD-10-CM | POA: Diagnosis not present

## 2018-06-17 DIAGNOSIS — N6320 Unspecified lump in the left breast, unspecified quadrant: Secondary | ICD-10-CM | POA: Diagnosis not present

## 2018-06-17 DIAGNOSIS — I10 Essential (primary) hypertension: Secondary | ICD-10-CM | POA: Diagnosis not present

## 2018-06-20 DIAGNOSIS — Z853 Personal history of malignant neoplasm of breast: Secondary | ICD-10-CM | POA: Diagnosis not present

## 2019-05-23 DIAGNOSIS — E039 Hypothyroidism, unspecified: Secondary | ICD-10-CM | POA: Diagnosis not present

## 2019-05-23 DIAGNOSIS — I10 Essential (primary) hypertension: Secondary | ICD-10-CM | POA: Diagnosis not present

## 2019-05-23 DIAGNOSIS — Z0001 Encounter for general adult medical examination with abnormal findings: Secondary | ICD-10-CM | POA: Diagnosis not present

## 2019-05-29 DIAGNOSIS — J309 Allergic rhinitis, unspecified: Secondary | ICD-10-CM | POA: Diagnosis not present

## 2019-05-29 DIAGNOSIS — J45909 Unspecified asthma, uncomplicated: Secondary | ICD-10-CM | POA: Diagnosis not present

## 2019-05-29 DIAGNOSIS — Z23 Encounter for immunization: Secondary | ICD-10-CM | POA: Diagnosis not present

## 2019-05-29 DIAGNOSIS — G43909 Migraine, unspecified, not intractable, without status migrainosus: Secondary | ICD-10-CM | POA: Diagnosis not present

## 2019-05-29 DIAGNOSIS — Z0000 Encounter for general adult medical examination without abnormal findings: Secondary | ICD-10-CM | POA: Diagnosis not present

## 2019-06-05 DIAGNOSIS — E890 Postprocedural hypothyroidism: Secondary | ICD-10-CM | POA: Diagnosis not present

## 2019-06-12 DIAGNOSIS — R221 Localized swelling, mass and lump, neck: Secondary | ICD-10-CM | POA: Diagnosis not present

## 2019-06-12 DIAGNOSIS — E890 Postprocedural hypothyroidism: Secondary | ICD-10-CM | POA: Diagnosis not present

## 2019-06-12 DIAGNOSIS — C73 Malignant neoplasm of thyroid gland: Secondary | ICD-10-CM | POA: Diagnosis not present

## 2019-06-12 DIAGNOSIS — I10 Essential (primary) hypertension: Secondary | ICD-10-CM | POA: Diagnosis not present

## 2019-06-19 ENCOUNTER — Other Ambulatory Visit: Payer: Self-pay | Admitting: Endocrinology

## 2019-06-19 DIAGNOSIS — R221 Localized swelling, mass and lump, neck: Secondary | ICD-10-CM

## 2019-06-24 ENCOUNTER — Other Ambulatory Visit: Payer: Self-pay | Admitting: Endocrinology

## 2019-06-24 DIAGNOSIS — R221 Localized swelling, mass and lump, neck: Secondary | ICD-10-CM

## 2019-06-24 DIAGNOSIS — C73 Malignant neoplasm of thyroid gland: Secondary | ICD-10-CM

## 2019-06-25 ENCOUNTER — Ambulatory Visit
Admission: RE | Admit: 2019-06-25 | Discharge: 2019-06-25 | Disposition: A | Discharge status: Home / Self Care | Payer: BC Managed Care – PPO | Source: Ambulatory Visit | Attending: Endocrinology | Admitting: Endocrinology

## 2019-06-25 DIAGNOSIS — E041 Nontoxic single thyroid nodule: Secondary | ICD-10-CM | POA: Diagnosis not present

## 2019-06-25 DIAGNOSIS — C73 Malignant neoplasm of thyroid gland: Secondary | ICD-10-CM

## 2019-06-25 DIAGNOSIS — R221 Localized swelling, mass and lump, neck: Secondary | ICD-10-CM | POA: Diagnosis not present

## 2019-08-14 DIAGNOSIS — E890 Postprocedural hypothyroidism: Secondary | ICD-10-CM | POA: Diagnosis not present

## 2019-08-14 DIAGNOSIS — Z23 Encounter for immunization: Secondary | ICD-10-CM | POA: Diagnosis not present

## 2019-10-30 DIAGNOSIS — E039 Hypothyroidism, unspecified: Secondary | ICD-10-CM | POA: Diagnosis not present

## 2019-10-30 DIAGNOSIS — I10 Essential (primary) hypertension: Secondary | ICD-10-CM | POA: Diagnosis not present

## 2019-10-30 DIAGNOSIS — R5383 Other fatigue: Secondary | ICD-10-CM | POA: Diagnosis not present

## 2019-10-30 DIAGNOSIS — M542 Cervicalgia: Secondary | ICD-10-CM | POA: Diagnosis not present

## 2019-11-11 DIAGNOSIS — E890 Postprocedural hypothyroidism: Secondary | ICD-10-CM | POA: Diagnosis not present

## 2019-11-11 DIAGNOSIS — C73 Malignant neoplasm of thyroid gland: Secondary | ICD-10-CM | POA: Diagnosis not present

## 2020-03-09 DIAGNOSIS — L570 Actinic keratosis: Secondary | ICD-10-CM | POA: Diagnosis not present

## 2020-03-09 DIAGNOSIS — L400 Psoriasis vulgaris: Secondary | ICD-10-CM | POA: Diagnosis not present

## 2020-04-08 DIAGNOSIS — E890 Postprocedural hypothyroidism: Secondary | ICD-10-CM | POA: Diagnosis not present

## 2020-04-08 DIAGNOSIS — C73 Malignant neoplasm of thyroid gland: Secondary | ICD-10-CM | POA: Diagnosis not present

## 2020-05-20 DIAGNOSIS — H2513 Age-related nuclear cataract, bilateral: Secondary | ICD-10-CM | POA: Diagnosis not present

## 2020-06-29 DIAGNOSIS — H25013 Cortical age-related cataract, bilateral: Secondary | ICD-10-CM | POA: Diagnosis not present

## 2020-06-29 DIAGNOSIS — H2513 Age-related nuclear cataract, bilateral: Secondary | ICD-10-CM | POA: Diagnosis not present

## 2020-06-29 DIAGNOSIS — H2512 Age-related nuclear cataract, left eye: Secondary | ICD-10-CM | POA: Diagnosis not present

## 2020-06-29 DIAGNOSIS — H18413 Arcus senilis, bilateral: Secondary | ICD-10-CM | POA: Diagnosis not present

## 2020-06-29 DIAGNOSIS — H25043 Posterior subcapsular polar age-related cataract, bilateral: Secondary | ICD-10-CM | POA: Diagnosis not present

## 2020-07-07 DIAGNOSIS — H2512 Age-related nuclear cataract, left eye: Secondary | ICD-10-CM | POA: Diagnosis not present

## 2020-07-08 DIAGNOSIS — H2511 Age-related nuclear cataract, right eye: Secondary | ICD-10-CM | POA: Diagnosis not present

## 2020-07-21 DIAGNOSIS — H2511 Age-related nuclear cataract, right eye: Secondary | ICD-10-CM | POA: Diagnosis not present

## 2020-11-30 DIAGNOSIS — E890 Postprocedural hypothyroidism: Secondary | ICD-10-CM | POA: Diagnosis not present

## 2020-11-30 DIAGNOSIS — C73 Malignant neoplasm of thyroid gland: Secondary | ICD-10-CM | POA: Diagnosis not present

## 2020-12-27 DIAGNOSIS — I10 Essential (primary) hypertension: Secondary | ICD-10-CM | POA: Diagnosis not present

## 2020-12-27 DIAGNOSIS — Z0001 Encounter for general adult medical examination with abnormal findings: Secondary | ICD-10-CM | POA: Diagnosis not present

## 2020-12-27 DIAGNOSIS — E039 Hypothyroidism, unspecified: Secondary | ICD-10-CM | POA: Diagnosis not present

## 2020-12-30 DIAGNOSIS — Z0000 Encounter for general adult medical examination without abnormal findings: Secondary | ICD-10-CM | POA: Diagnosis not present

## 2021-01-17 ENCOUNTER — Other Ambulatory Visit: Payer: Self-pay | Admitting: Internal Medicine

## 2021-01-17 DIAGNOSIS — Z0000 Encounter for general adult medical examination without abnormal findings: Secondary | ICD-10-CM

## 2021-02-07 ENCOUNTER — Other Ambulatory Visit: Payer: BC Managed Care – PPO

## 2021-02-10 ENCOUNTER — Ambulatory Visit
Admission: RE | Admit: 2021-02-10 | Discharge: 2021-02-10 | Disposition: A | Discharge status: Home / Self Care | Payer: No Typology Code available for payment source | Source: Ambulatory Visit | Attending: Internal Medicine | Admitting: Internal Medicine

## 2021-02-10 DIAGNOSIS — Z136 Encounter for screening for cardiovascular disorders: Secondary | ICD-10-CM | POA: Diagnosis not present

## 2021-02-10 DIAGNOSIS — Z0000 Encounter for general adult medical examination without abnormal findings: Secondary | ICD-10-CM

## 2021-02-10 DIAGNOSIS — Z87891 Personal history of nicotine dependence: Secondary | ICD-10-CM | POA: Diagnosis not present

## 2021-02-14 DIAGNOSIS — H532 Diplopia: Secondary | ICD-10-CM | POA: Diagnosis not present

## 2021-02-14 DIAGNOSIS — R001 Bradycardia, unspecified: Secondary | ICD-10-CM | POA: Diagnosis not present

## 2021-02-14 DIAGNOSIS — R55 Syncope and collapse: Secondary | ICD-10-CM | POA: Diagnosis not present

## 2021-02-14 DIAGNOSIS — R002 Palpitations: Secondary | ICD-10-CM | POA: Diagnosis not present

## 2021-02-24 DIAGNOSIS — H26493 Other secondary cataract, bilateral: Secondary | ICD-10-CM | POA: Diagnosis not present

## 2021-02-28 DIAGNOSIS — R55 Syncope and collapse: Secondary | ICD-10-CM | POA: Diagnosis not present

## 2021-03-01 DIAGNOSIS — R55 Syncope and collapse: Secondary | ICD-10-CM | POA: Diagnosis not present

## 2021-03-14 ENCOUNTER — Other Ambulatory Visit: Payer: Self-pay

## 2021-03-14 ENCOUNTER — Encounter: Payer: Self-pay | Admitting: Cardiology

## 2021-03-14 ENCOUNTER — Ambulatory Visit: Payer: BC Managed Care – PPO | Admitting: Cardiology

## 2021-03-14 VITALS — BP 131/80 | HR 85 | Temp 98.1°F | Resp 16 | Ht 63.0 in | Wt 130.0 lb

## 2021-03-14 DIAGNOSIS — R55 Syncope and collapse: Secondary | ICD-10-CM

## 2021-03-14 DIAGNOSIS — E890 Postprocedural hypothyroidism: Secondary | ICD-10-CM | POA: Diagnosis not present

## 2021-03-14 DIAGNOSIS — R002 Palpitations: Secondary | ICD-10-CM | POA: Diagnosis not present

## 2021-03-14 DIAGNOSIS — Z85850 Personal history of malignant neoplasm of thyroid: Secondary | ICD-10-CM

## 2021-03-14 DIAGNOSIS — Z87891 Personal history of nicotine dependence: Secondary | ICD-10-CM

## 2021-03-14 NOTE — Progress Notes (Signed)
Date:  03/14/2021   ID:  Rhonda Deleon, DOB 11/16/1965, MRN [id number]  PCP:  Deland Pretty, MD  Cardiologist:  Rex Kras, DO, [facility] (established care 03/14/2021)  REASON FOR CONSULT: Palpitations/near syncope  REQUESTING PHYSICIAN:  Deland Pretty, MD [address] [place],  [place] [address]  Chief Complaint  Patient presents with   Bradycardia   Palpitations   Near Syncope   New Patient (Initial Visit)    HPI  Rhonda Deleon is a 55 y.o. female who presents to the office with a chief complaint of " nearly passing out and palpitations." Patient's past medical history and cardiovascular risk factors include: Thyroid Cancer s/p surgery, post-operative hypothyroidism.  She is referred to the office at the request of Deland Pretty, MD for evaluation of palpitations/near syncope.  Patient states that approximately 6 weeks ago she went to the hospital to pick up her husband.  While she was in the hospital she had an episode of sudden onset of diaphoresis, questionable blurred vision/tunnel vision, and felt like she would pass out but did not.  The nursing staff helped her sit down checked her blood pressures which had dropped and after resting for some time her symptoms resolved and she took her husband back home.  Patient has not had any episodes of syncope.  She has had similar events in the past without a unifying diagnoses.  Patient states that she eats a balanced diet and stays well-hydrated.  She consumes 2 cups of 8 ounce of black coffee on a daily basis without any significant other caffeinated sources.  No new over-the-counter medications, prescription drugs, illicits, herbal supplements, weight loss supplements, or energy drinks.  Symptoms are usually brought on by stressful situations.  No family history of premature coronary disease or sudden cardiac death.  FUNCTIONAL STATUS: Hikes 3 miles a day.    ALLERGIES: Allergies  Allergen Reactions    Adhesive [Tape]     Steri strips "made skin look like a burn"     Chloraprep One Step [Chlorhexidine Gluconate] Hives, Itching and Rash    MEDICATION LIST PRIOR TO VISIT: Current Meds  Medication Sig   Ascorbic Acid (VITAMIN C) 500 MG CAPS w capsules   cholecalciferol (VITAMIN D3) 25 MCG (1000 UNIT) tablet 1 tablet   Magnesium 300 MG CAPS Take 1 capsule by mouth daily.   SYNTHROID 112 MCG tablet Take 112 mcg by mouth daily.   vitamin B-12 (CYANOCOBALAMIN) 500 MCG tablet Take 2 tablets by mouth daily.     PAST MEDICAL HISTORY: Past Medical History:  Diagnosis Date   Arthritis    psoriatic   Asthma    last attack yrs. 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 old   Thyroid cancer [facility])     PAST SURGICAL HISTORY: Past Surgical History:  Procedure Laterality Date   ABDOMINAL HYSTERECTOMY     3/13-vag-LOA   BREAST DUCTAL SYSTEM EXCISION Left 05/07/2014   Procedure: EXCISION LEFT BREAST DUCTAL SYSTEM ;  Surgeon: Adin Hector, MD;  Location: [facility];  Service: General;  Laterality: Left;   BREAST LUMPECTOMY WITH NEEDLE LOCALIZATION Left 08/15/2013   Procedure: BREAST LUMPECTOMY WITH NEEDLE LOCALIZATION;  Surgeon: Adin Hector, MD;  Location: [facility];  Service: General;  Laterality: Left;   BREAST LUMPECTOMY WITH RADIOACTIVE SEED LOCALIZATION Right 12/02/2013   Procedure: RIGHT BREAST PARTIAL MASTECOMY WITH RADIOACTIVE SEED LOCALIZATION;  Surgeon: Adin Hector, MD;  Location: [place];  Service: General;  Laterality: Right;   CATARACT EXTRACTION Bilateral    CESAREAN SECTION  09/25/1989   MASTECTOMY Bilateral 09/25/2014   [facility]:Dr. Phineas Douglas    ROBOTIC ASSISTED LAP VAGINAL HYSTERECTOMY  12/05/2011   THYROIDECTOMY  07/23/2015   THYROIDECTOMY Bilateral 07/23/2015   Procedure: BILATERAL TOTAL THYROIDECTOMY;  Surgeon: Izora Gala, MD;  Location: [facility];  Service: ENT;  Laterality: Bilateral;   WISDOM TOOTH EXTRACTION  as child    FAMILY HISTORY: The patient family history includes Cancer in her maternal grandmother; Hypertension in her father and mother.  SOCIAL HISTORY:  The patient  reports that she quit smoking about 23 years ago. Her smoking use included cigarettes. She has never used smokeless tobacco. She reports current alcohol use of about 2.0 standard drinks of alcohol per week. She reports that she does not use drugs.  REVIEW OF SYSTEMS: Review of Systems  Constitutional: Negative for chills and fever.  HENT:  Negative for hoarse voice and nosebleeds.   Eyes:  Negative for discharge, double vision and pain.  Cardiovascular:  Positive for near-syncope and palpitations. Negative for chest pain, claudication, dyspnea on exertion, leg swelling, orthopnea, paroxysmal nocturnal dyspnea and syncope.  Respiratory:  Negative for hemoptysis and shortness of breath.   Musculoskeletal:  Negative for muscle cramps and myalgias.  Gastrointestinal:  Negative for abdominal pain, constipation, diarrhea, hematemesis, hematochezia, melena, nausea and vomiting.  Neurological:  Negative for dizziness and light-headedness.   PHYSICAL EXAM: Vitals with BMI 03/14/2021 05/14/2017 07/24/2015  Height 5\' 3"  - -  Weight 130 lbs - -  BMI 38.25 - -  Systolic 053 976 734  Diastolic 80 69 45  Pulse 85 - 86   Orthostatic VS for the past 72 hrs (Last 3 readings):  Orthostatic BP Patient Position BP Location Cuff Size Orthostatic Pulse  03/14/21 1340 124/76 Standing Left Arm Normal 85  03/14/21 1339 121/82 Sitting Left Arm Normal 75  03/14/21 1338 117/69 Supine Left Arm Normal 80    CONSTITUTIONAL: Well-developed and well-nourished. No acute  distress.  SKIN: Skin is warm and dry. No rash noted. No cyanosis. No pallor. No jaundice HEAD: Normocephalic and atraumatic.  EYES: No scleral icterus MOUTH/THROAT: Moist oral membranes.  NECK: No JVD present. No thyromegaly noted. No carotid bruits  LYMPHATIC: No visible cervical adenopathy.  CHEST Normal respiratory effort. No intercostal retractions.  Bilateral mastectomies LUNGS: Clear to auscultation bilaterally.  No stridor. No wheezes. No rales.  CARDIOVASCULAR: Regular, positive S1-S2, no murmurs rubs or gallops appreciated. ABDOMINAL: No apparent ascites.  EXTREMITIES: No peripheral edema  HEMATOLOGIC: No significant bruising NEUROLOGIC: Oriented to person, place, and time. Nonfocal. Normal muscle tone.  PSYCHIATRIC: Normal mood and affect. Normal behavior. Cooperative  CARDIAC DATABASE: EKG: 03/14/2021: Normal sinus rhythm, 81 bpm, normal axis, poor R wave progression, without underlying ischemia or injury pattern.   Echocardiogram: No results found for this or any previous visit from the past 1095 days.    Stress Testing: No results found for this or any previous visit from the past 1095 days.   Heart Catheterization: None  Coronary calcium score: 02/10/2021: Total coronary calcium score 0.  Extended Holter monitor: Performed by Dr. Shelia Media May 2022. 7-day monitoring. Heart rate 57-143, average heart  rate 82 bpm No documented nonsustained ventricular tachycardia, pauses greater than 3 seconds, or AV blocks No patient triggered events. Total ventricular ectopic burden less than 1% Total supraventricular ectopic burden less than 1% No significant dysrhythmias noted.  LABORATORY DATA: CBC Latest Ref Rng & Units 07/21/2015 05/07/2014 12/02/2013  WBC 4.0 - 10.5 K/uL 7.5 - -  Hemoglobin 12.0 - 15.0 g/dL 13.6 14.3 12.9  Hematocrit 36.0 - 46.0 % 38.9 - -  Platelets 150 - 400 K/uL 292 - -    CMP Latest Ref Rng & Units 07/24/2015 07/23/2015 07/23/2015  Glucose 65 -  99 mg/dL - - -  BUN 6 - 20 mg/dL - - -  Creatinine 0.44 - 1.00 mg/dL - - -  Sodium 135 - 145 mmol/L - - -  Potassium 3.5 - 5.1 mmol/L - - -  Chloride 101 - 111 mmol/L - - -  CO2 22 - 32 mmol/L - - -  Calcium 8.9 - 10.3 mg/dL 8.9 9.0 8.8(L)    Lipid Panel  No results found for: CHOL, TRIG, HDL, CHOLHDL, VLDL, LDLCALC, LDLDIRECT, LABVLDL  No components found for: NTPROBNP No results for input(s): PROBNP in the last 8760 hours. No results for input(s): TSH in the last 8760 hours.  BMP No results for input(s): NA, K, CL, CO2, GLUCOSE, BUN, CREATININE, CALCIUM, GFRNONAA, GFRAA in the last 8760 hours.  HEMOGLOBIN A1C No results found for: HGBA1C, MPG  External Labs: Collected: 12/27/2020 provided by primary care Hemoglobin 13.6 g/dL. Hematocrit 40.4%. Sodium 141, potassium 4, chloride 101, bicarb 22, BUN 12, creatinine 0.61 milligrams per deciliter. AST 20, ALT 30, alkaline phosphatase 61 Total cholesterol 208, triglycerides 76, HDL 76, LDL 117, non-HDL 132 TSH 1.77  IMPRESSION:    ICD-10-CM   1. Palpitations  R00.2 EKG 12-Lead    2. Near syncope  R55 PCV ECHOCARDIOGRAM COMPLETE    3. Postoperative hypothyroidism  E89.0     4. Hx of thyroid cancer  Z85.850     5. Former smoker  Z87.891        RECOMMENDATIONS: Rhonda Deleon is a 55 y.o. female whose past medical history and cardiac risk factors include: Thyroid Cancer s/p surgery, post-operative hypothyroidism.  Palpitations: TSH within normal limits. Hemoglobin within normal limits. No known reversible causes. EKG shows normal sinus rhythm without underlying dysrhythmias. 7-day extended Holter monitor was performed by PCP results independently reviewed and noted above. Orthostatic vital signs negative. Coronary calcium score 0.  Near-syncope: Symptoms suggestive of vasovagal etiology. Echocardiogram will be ordered to evaluate for structural heart disease and left ventricular systolic function. See  above  History of thyroid cancer status post operative hypothyroidism: Currently on Synthroid.  No change in overall functional status, she hikes/walks at least 3 miles on a daily basis, coronary calcium score is 0, shared decision was to hold off on an ischemic evaluation at this time.  If the symptoms of near syncope increase in intensity, frequency, and/or duration or so she has symptoms of frank syncope additional cardiovascular testing will be considered.  Patient is agreeable with the plan of care.   FINAL MEDICATION LIST END OF ENCOUNTER: No orders of the defined types were placed in this encounter.   Medications Discontinued During This Encounter  Medication Reason   gabapentin (NEURONTIN) 100 MG capsule Error   Betamethasone Valerate 0.12 % foam Error     Current Outpatient Medications:    Ascorbic Acid (VITAMIN C) 500 MG CAPS, w capsules, Disp: , Rfl:    cholecalciferol (  VITAMIN D3) 25 MCG (1000 UNIT) tablet, 1 tablet, Disp: , Rfl:    Magnesium 300 MG CAPS, Take 1 capsule by mouth daily., Disp: , Rfl:    SYNTHROID 112 MCG tablet, Take 112 mcg by mouth daily., Disp: , Rfl: 12   vitamin B-12 (CYANOCOBALAMIN) 500 MCG tablet, Take 2 tablets by mouth daily., Disp: , Rfl:   Orders Placed This Encounter  Procedures   EKG 12-Lead   PCV ECHOCARDIOGRAM COMPLETE    There are no Patient Instructions on file for this visit.   --Continue cardiac medications as reconciled in final medication list. --Return in about 1 year (around 03/14/2022) for Follow up near syncope. . Or sooner if needed. --Continue follow-up with your primary care physician regarding the management of your other chronic comorbid conditions.  Patient's questions and concerns were addressed to her satisfaction. She voices understanding of the instructions provided during this encounter.   This note was created using a voice recognition software as a result there may be grammatical errors inadvertently enclosed that  do not reflect the nature of this encounter. Every attempt is made to correct such errors.  Rex Kras, [place], [facility]  Pager: [phone] Office: [phone]

## 2021-03-17 DIAGNOSIS — Z961 Presence of intraocular lens: Secondary | ICD-10-CM | POA: Diagnosis not present

## 2021-03-17 DIAGNOSIS — H18413 Arcus senilis, bilateral: Secondary | ICD-10-CM | POA: Diagnosis not present

## 2021-03-17 DIAGNOSIS — H26493 Other secondary cataract, bilateral: Secondary | ICD-10-CM | POA: Diagnosis not present

## 2021-03-17 DIAGNOSIS — H26492 Other secondary cataract, left eye: Secondary | ICD-10-CM | POA: Diagnosis not present

## 2021-03-17 DIAGNOSIS — H26491 Other secondary cataract, right eye: Secondary | ICD-10-CM | POA: Diagnosis not present

## 2021-03-24 ENCOUNTER — Other Ambulatory Visit: Payer: BC Managed Care – PPO

## 2021-03-29 ENCOUNTER — Ambulatory Visit: Payer: BC Managed Care – PPO

## 2021-03-29 ENCOUNTER — Other Ambulatory Visit: Payer: Self-pay

## 2021-03-29 DIAGNOSIS — R55 Syncope and collapse: Secondary | ICD-10-CM | POA: Diagnosis not present

## 2021-05-03 DIAGNOSIS — H26492 Other secondary cataract, left eye: Secondary | ICD-10-CM | POA: Diagnosis not present

## 2021-06-03 DIAGNOSIS — C73 Malignant neoplasm of thyroid gland: Secondary | ICD-10-CM | POA: Diagnosis not present

## 2021-06-03 DIAGNOSIS — E890 Postprocedural hypothyroidism: Secondary | ICD-10-CM | POA: Diagnosis not present

## 2021-06-09 ENCOUNTER — Ambulatory Visit: Payer: BC Managed Care – PPO | Admitting: Orthopedic Surgery

## 2021-06-09 ENCOUNTER — Other Ambulatory Visit: Payer: Self-pay

## 2021-06-09 ENCOUNTER — Ambulatory Visit: Payer: Self-pay

## 2021-06-09 DIAGNOSIS — M25511 Pain in right shoulder: Secondary | ICD-10-CM | POA: Diagnosis not present

## 2021-06-09 DIAGNOSIS — G8929 Other chronic pain: Secondary | ICD-10-CM

## 2021-06-13 ENCOUNTER — Encounter: Payer: Self-pay | Admitting: Orthopedic Surgery

## 2021-06-13 NOTE — Progress Notes (Signed)
Office Visit Note   Patient: Rhonda Deleon           Date of Birth: 1965-11-18           MRN: [id number] Visit Date: 06/09/2021              Requested by: Deland Pretty, MD [address] [place],  [place] [address] PCP: Deland Pretty, MD  Chief Complaint  Patient presents with   Right Shoulder - Pain      HPI: Patient is a 55 year old woman who presents with a several month history of right shoulder pain.  She has pain with abduction and flexion and overhead activities.  Patient denies any pain with internal rotation.  Patient is status post bilateral mastectomies.  Assessment & Plan: Visit Diagnoses:  1. Chronic right shoulder pain     Plan: We will set patient up with physical therapy most of her symptoms seem to be related to the surgical involvement of the pectoralis muscles.  We will follow-up as needed after therapy.  Follow-Up Instructions: No follow-ups on file.   Ortho Exam  Patient is alert, oriented, no adenopathy, well-dressed, normal affect, normal respiratory effort. Examination patient has pain with external rotation of the shoulder the biceps is not tender to palpation but she is point tender to palpation over the pectoralis muscles.  She has no pain with internal rotation.  She has a negative sulcus sign she has abduction and flexion to 120 degrees.  Imaging: No results found. No images are attached to the encounter.  Labs: No results found for: HGBA1C, ESRSEDRATE, CRP, LABURIC, REPTSTATUS, GRAMSTAIN, CULT, LABORGA   No results found for: ALBUMIN, PREALBUMIN, CBC  No results found for: MG No results found for: VD25OH  No results found for: PREALBUMIN CBC EXTENDED Latest Ref Rng & Units 07/21/2015 05/07/2014 12/02/2013  WBC 4.0 - 10.5 K/uL 7.5 - -  RBC 3.87 - 5.11 MIL/uL 4.43 - -  HGB 12.0 - 15.0 g/dL 13.6 14.3 12.9  HCT 36.0 - 46.0 % 38.9 - -  PLT 150 - 400 K/uL 292 - -     There is no height or weight on file to calculate  BMI.  Orders:  Orders Placed This Encounter  Procedures   XR Shoulder Right   Ambulatory referral to Physical Therapy   No orders of the defined types were placed in this encounter.    Procedures: No procedures performed  Clinical Data: No additional findings.  ROS:  All other systems negative, except as noted in the HPI. Review of Systems  Objective: Vital Signs: LMP 11/18/2011   Specialty Comments:  No specialty comments available.  PMFS History: Patient Active Problem List   Diagnosis Date Noted   Surgery, elective 07/23/2015   Papilloma of left breast 05/25/2014   Nipple discharge, bloody 04/14/2014   Papilloma of right breast 11/25/2013   Papilloma of breast 08/07/2013   Migraine 02/14/2013   Dystonia 02/14/2013   Localization-related (focal) (partial) epilepsy and epileptic syndromes with complex partial seizures, with intractable epilepsy 02/14/2013   Other extrapyramidal disease and abnormal movement disorder 02/14/2013   Past Medical History:  Diagnosis Date   Arthritis    psoriatic   Asthma    last attack yrs. 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 old   Thyroid cancer ([place])     Family History  Problem Relation Age of Onset   Hypertension Mother    Hypertension Father    Cancer Maternal Grandmother        breast    Past Surgical History:  Procedure Laterality Date   ABDOMINAL HYSTERECTOMY     3/13-vag-LOA   BREAST DUCTAL SYSTEM EXCISION Left 05/07/2014   Procedure: EXCISION LEFT BREAST DUCTAL SYSTEM ;  Surgeon: Adin Hector, MD;  Location: [facility];  Service: General;  Laterality: Left;   BREAST LUMPECTOMY WITH NEEDLE LOCALIZATION Left 08/15/2013    Procedure: BREAST LUMPECTOMY WITH NEEDLE LOCALIZATION;  Surgeon: Adin Hector, MD;  Location: [facility];  Service: General;  Laterality: Left;   BREAST LUMPECTOMY WITH RADIOACTIVE SEED LOCALIZATION Right 12/02/2013   Procedure: RIGHT BREAST PARTIAL MASTECOMY WITH RADIOACTIVE SEED LOCALIZATION;  Surgeon: Adin Hector, MD;  Location: [facility];  Service: General;  Laterality: Right;   CATARACT EXTRACTION Bilateral    CESAREAN SECTION  09/25/1989   MASTECTOMY Bilateral 09/25/2014   [facility]:Dr. Phineas Douglas   ROBOTIC ASSISTED LAP VAGINAL HYSTERECTOMY  12/05/2011   THYROIDECTOMY  07/23/2015   THYROIDECTOMY Bilateral 07/23/2015   Procedure: BILATERAL TOTAL THYROIDECTOMY;  Surgeon: Izora Gala, MD;  [facility];  Service: ENT;  Laterality: Bilateral;   WISDOM TOOTH EXTRACTION  as child   Social History   Occupational History   Not on file  Tobacco Use   Smoking status: Former    Types: Cigarettes    Quit date: 09/25/1997    Years since quitting: 23.7   Smokeless tobacco: Never  Vaping Use   Vaping Use: Never used  Substance and Sexual Activity   Alcohol use: Yes    Alcohol/week: 2.0 standard drinks    Types: 2 Standard drinks or equivalent per week    Comment: social   Drug use: No   Sexual activity: Yes    Birth control/protection: Surgical

## 2021-07-02 IMAGING — CT CT CARDIAC CORONARY ARTERY CALCIUM SCORE
3 series · 13 of 20 positions shown, 15 images · non-contrast
Comparison: 07/21/2015 chest radiograph. No prior CT.

CLINICAL DATA: Family history of heart disease. Ex-smoker.
Bilateral mastectomy.

EXAM:
CT CARDIAC CORONARY ARTERY CALCIUM SCORE
TECHNIQUE: Non-contrast imaging through the heart was performed using
prospective ECG gating. Image post processing was performed on an
independent workstation, allowing for quantitative analysis of the
heart and coronary arteries. Note that this exam targets the heart
and the chest was not imaged in its entirety.

[Series 2: calcium scoring 2.00 qr36 bestdiast 71% hrt calciu · axial · 0.35mm/px · z∈[+1625,+1689]mm · 3 of 80 slices shown]
[im 16/80  vessel]
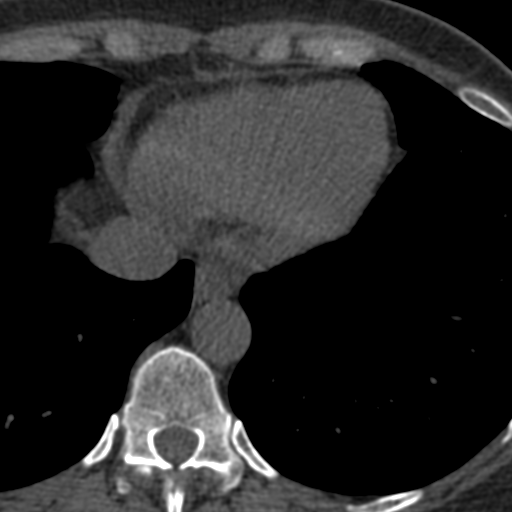
[im 32/80  vessel]
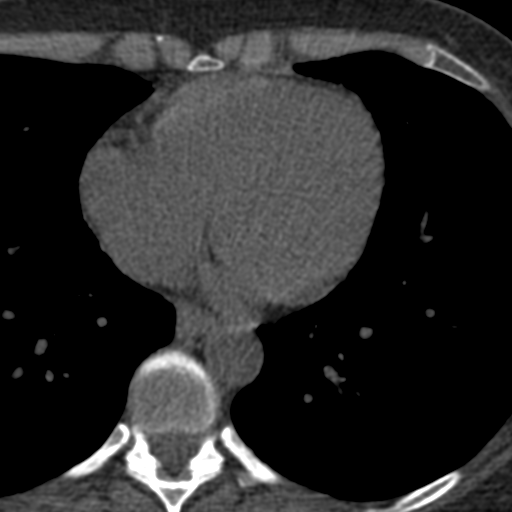
[im 48/80  vessel]
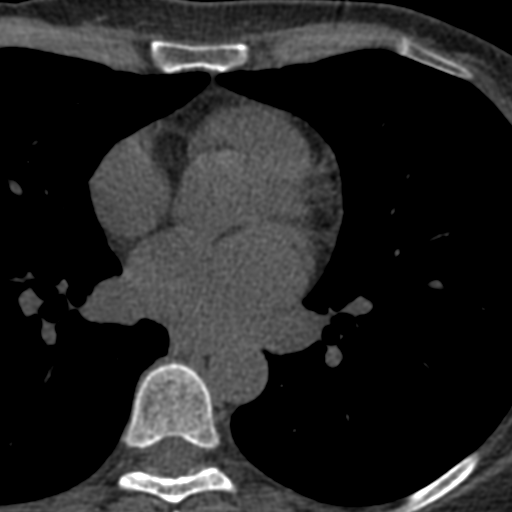

[Series 3: calcium scoring 2.00 br40 bestdiast 71% axial · axial · 0.47mm/px · z∈[+1621,+1725]mm · 5 of 80 slices shown, 7 images]
[im 14/80  vessel]
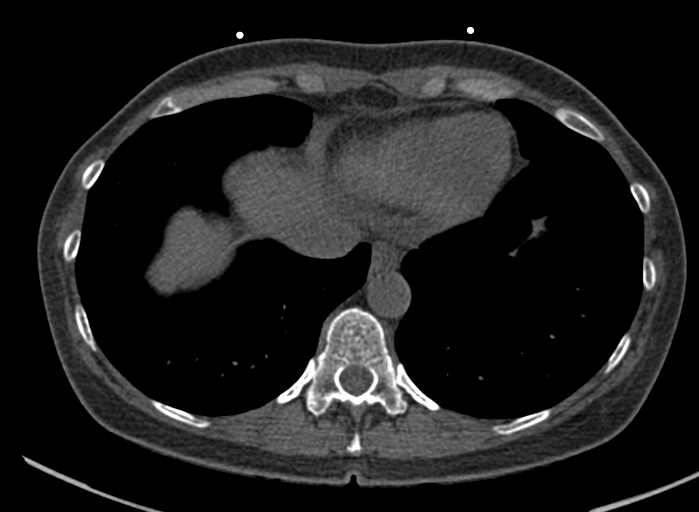
[im 14/80  lung]
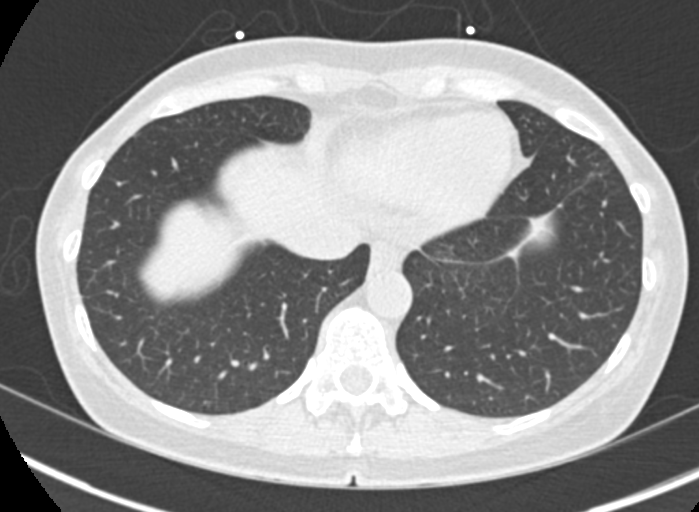
[im 27/80  vessel]
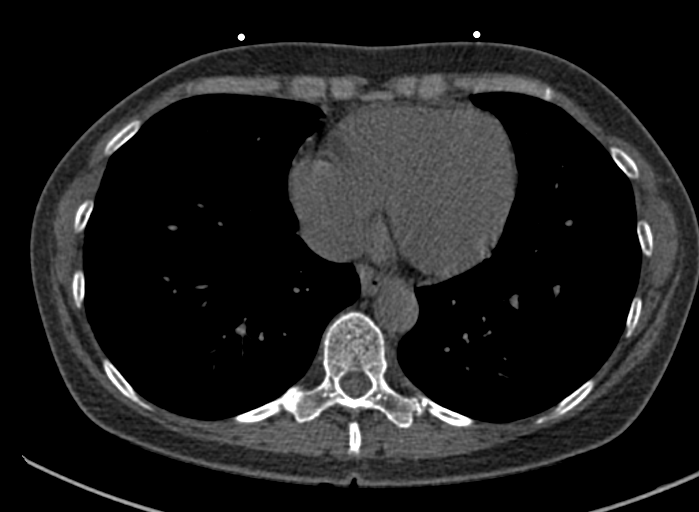
[im 40/80  vessel]
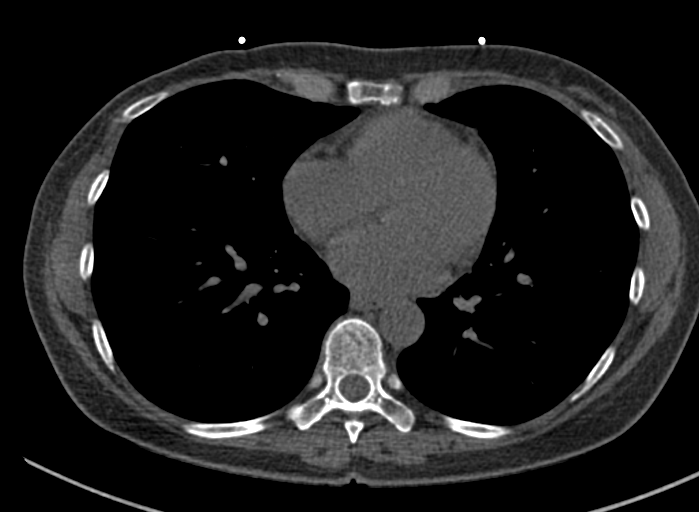
[im 53/80  vessel]
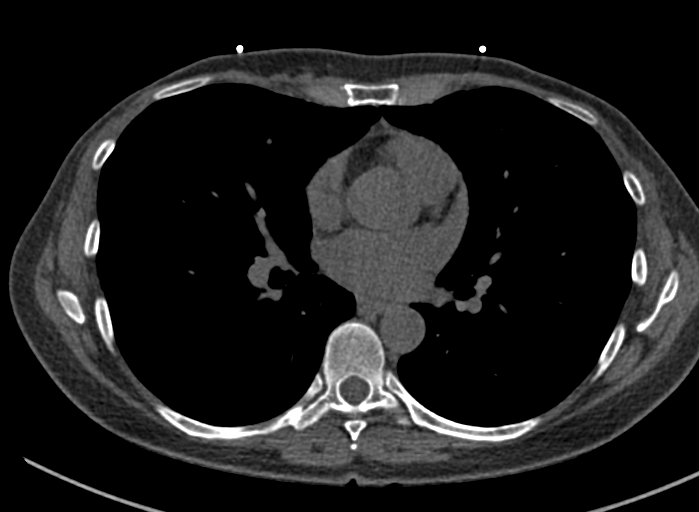
[im 66/80  vessel]
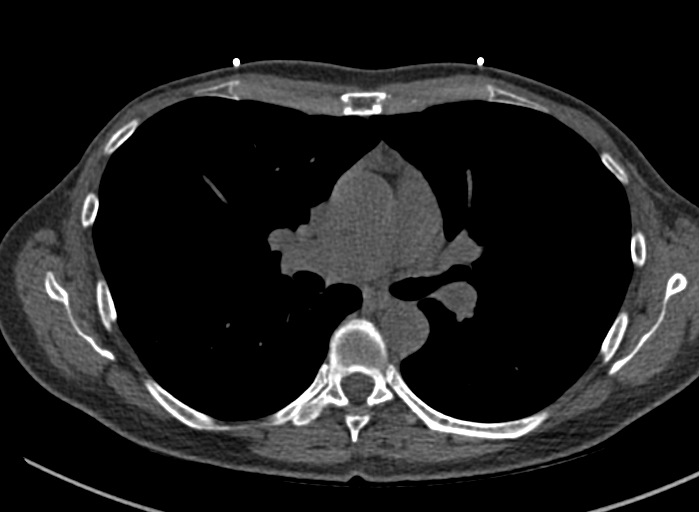
[im 66/80  lung]
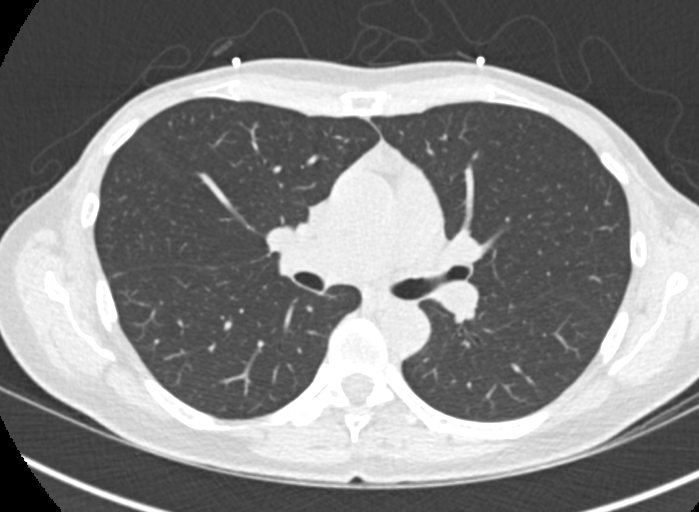

[Series 9: calcium scoring 2.00 br60 bestdiast 71% lungs · axial · 0.45mm/px · z∈[+1621,+1725]mm · 5 of 80 slices shown]
[im 14/80  vessel]
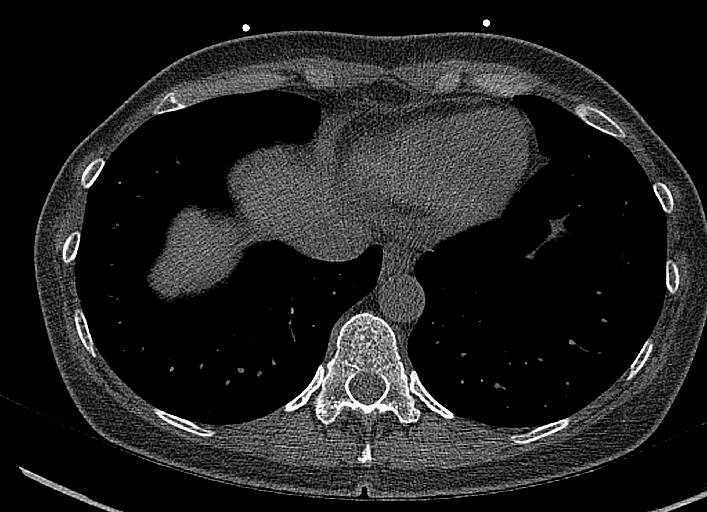
[im 27/80  vessel]
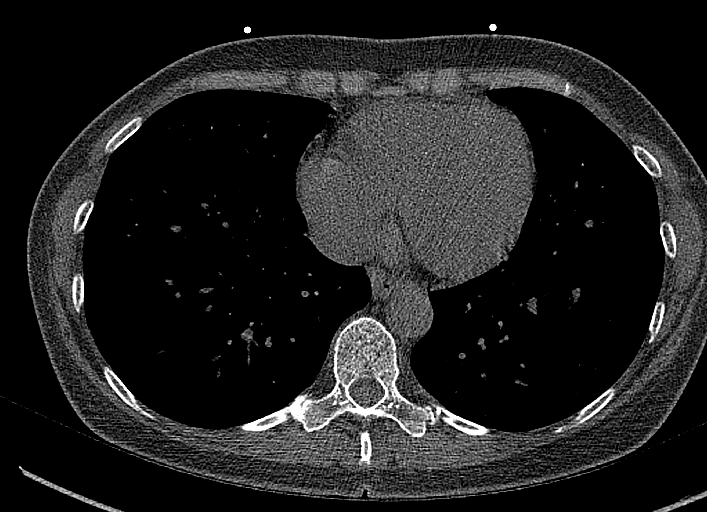
[im 40/80  vessel]
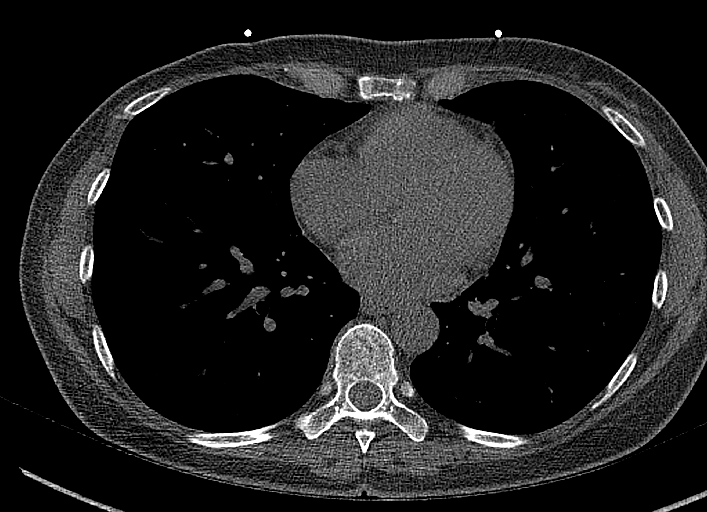
[im 53/80  vessel]
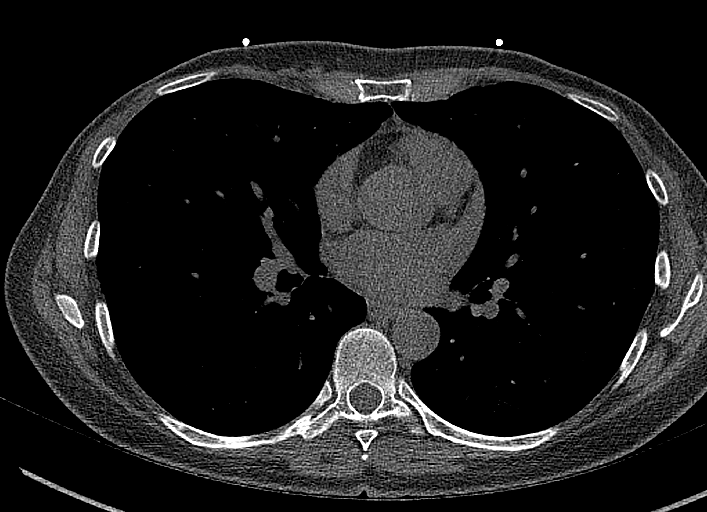
[im 66/80  vessel]
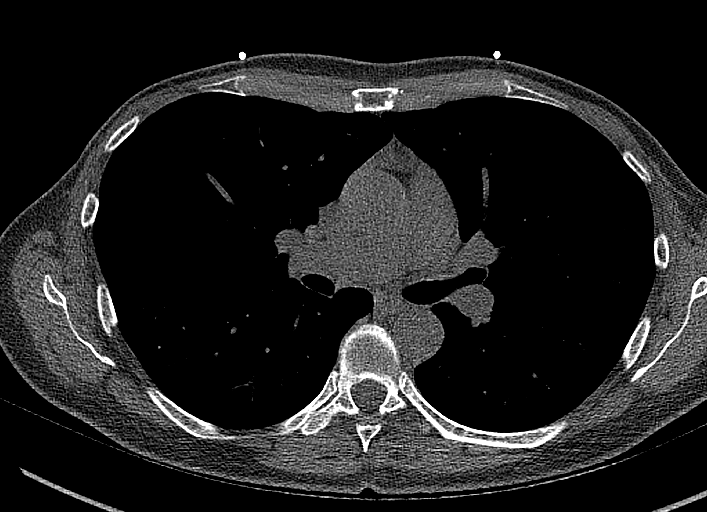

[13 of 20 positions shown; findings below may reference images not displayed]

FINDINGS: CORONARY CALCIUM SCORES:

Total Agatston Score: 0- No coronary calcium identified.

AORTA MEASUREMENTS:

Ascending Aorta: 26 mm

Descending Aorta: 22 mm

OTHER FINDINGS:

Cardiovascular: Normal aortic caliber. Normal heart size, without
pericardial effusion.

Mediastinum/Nodes: No imaged thoracic adenopathy. There may be
minimal residual thymic tissue in the anterior mediastinum,
including on [DATE]. Favored to be within normal variation.

Lungs/Pleura: No pleural fluid.  Clear imaged lungs.

Upper Abdomen: Normal imaged portions of the liver, spleen, stomach.

Musculoskeletal: Bilateral mastectomy. No acute osseous abnormality.
IMPRESSION: 1. No coronary calcium identified.
2. No acute process in the imaged chest.

## 2021-12-01 DIAGNOSIS — C73 Malignant neoplasm of thyroid gland: Secondary | ICD-10-CM | POA: Diagnosis not present

## 2021-12-01 DIAGNOSIS — E890 Postprocedural hypothyroidism: Secondary | ICD-10-CM | POA: Diagnosis not present

## 2021-12-01 DIAGNOSIS — R5382 Chronic fatigue, unspecified: Secondary | ICD-10-CM | POA: Diagnosis not present

## 2021-12-27 DIAGNOSIS — S00452A Superficial foreign body of left ear, initial encounter: Secondary | ICD-10-CM | POA: Diagnosis not present

## 2021-12-27 DIAGNOSIS — Z87891 Personal history of nicotine dependence: Secondary | ICD-10-CM | POA: Diagnosis not present

## 2021-12-27 DIAGNOSIS — H6122 Impacted cerumen, left ear: Secondary | ICD-10-CM | POA: Diagnosis not present

## 2022-02-02 DIAGNOSIS — E039 Hypothyroidism, unspecified: Secondary | ICD-10-CM | POA: Diagnosis not present

## 2022-02-02 DIAGNOSIS — Z0000 Encounter for general adult medical examination without abnormal findings: Secondary | ICD-10-CM | POA: Diagnosis not present

## 2022-02-09 DIAGNOSIS — L4050 Arthropathic psoriasis, unspecified: Secondary | ICD-10-CM | POA: Diagnosis not present

## 2022-02-09 DIAGNOSIS — Z0000 Encounter for general adult medical examination without abnormal findings: Secondary | ICD-10-CM | POA: Diagnosis not present

## 2022-02-09 DIAGNOSIS — J309 Allergic rhinitis, unspecified: Secondary | ICD-10-CM | POA: Diagnosis not present

## 2022-02-09 DIAGNOSIS — E890 Postprocedural hypothyroidism: Secondary | ICD-10-CM | POA: Diagnosis not present

## 2022-02-09 DIAGNOSIS — Z23 Encounter for immunization: Secondary | ICD-10-CM | POA: Diagnosis not present

## 2022-02-22 DIAGNOSIS — M1990 Unspecified osteoarthritis, unspecified site: Secondary | ICD-10-CM | POA: Diagnosis not present

## 2022-02-22 DIAGNOSIS — M25561 Pain in right knee: Secondary | ICD-10-CM | POA: Diagnosis not present

## 2022-02-22 DIAGNOSIS — M79642 Pain in left hand: Secondary | ICD-10-CM | POA: Diagnosis not present

## 2022-02-22 DIAGNOSIS — M2550 Pain in unspecified joint: Secondary | ICD-10-CM | POA: Diagnosis not present

## 2022-02-22 DIAGNOSIS — L4050 Arthropathic psoriasis, unspecified: Secondary | ICD-10-CM | POA: Diagnosis not present

## 2022-02-22 DIAGNOSIS — M79672 Pain in left foot: Secondary | ICD-10-CM | POA: Diagnosis not present

## 2022-02-22 DIAGNOSIS — Z85850 Personal history of malignant neoplasm of thyroid: Secondary | ICD-10-CM | POA: Diagnosis not present

## 2022-02-22 DIAGNOSIS — M79671 Pain in right foot: Secondary | ICD-10-CM | POA: Diagnosis not present

## 2022-02-22 DIAGNOSIS — M79641 Pain in right hand: Secondary | ICD-10-CM | POA: Diagnosis not present

## 2022-02-22 DIAGNOSIS — M25562 Pain in left knee: Secondary | ICD-10-CM | POA: Diagnosis not present

## 2022-03-14 ENCOUNTER — Ambulatory Visit: Payer: BC Managed Care – PPO | Admitting: Cardiology

## 2022-03-27 DIAGNOSIS — L4050 Arthropathic psoriasis, unspecified: Secondary | ICD-10-CM | POA: Diagnosis not present

## 2022-03-27 DIAGNOSIS — M2550 Pain in unspecified joint: Secondary | ICD-10-CM | POA: Diagnosis not present

## 2022-03-27 DIAGNOSIS — Z85850 Personal history of malignant neoplasm of thyroid: Secondary | ICD-10-CM | POA: Diagnosis not present

## 2022-03-27 DIAGNOSIS — M1990 Unspecified osteoarthritis, unspecified site: Secondary | ICD-10-CM | POA: Diagnosis not present

## 2022-03-27 DIAGNOSIS — E039 Hypothyroidism, unspecified: Secondary | ICD-10-CM | POA: Diagnosis not present

## 2022-08-24 DIAGNOSIS — N951 Menopausal and female climacteric states: Secondary | ICD-10-CM | POA: Diagnosis not present

## 2022-08-24 DIAGNOSIS — M8580 Other specified disorders of bone density and structure, unspecified site: Secondary | ICD-10-CM | POA: Diagnosis not present

## 2022-08-24 DIAGNOSIS — E890 Postprocedural hypothyroidism: Secondary | ICD-10-CM | POA: Diagnosis not present

## 2022-08-24 DIAGNOSIS — M8589 Other specified disorders of bone density and structure, multiple sites: Secondary | ICD-10-CM | POA: Diagnosis not present

## 2022-08-24 DIAGNOSIS — C73 Malignant neoplasm of thyroid gland: Secondary | ICD-10-CM | POA: Diagnosis not present

## 2023-02-12 DIAGNOSIS — Z0001 Encounter for general adult medical examination with abnormal findings: Secondary | ICD-10-CM | POA: Diagnosis not present

## 2023-02-14 DIAGNOSIS — J309 Allergic rhinitis, unspecified: Secondary | ICD-10-CM | POA: Diagnosis not present

## 2023-02-14 DIAGNOSIS — L4050 Arthropathic psoriasis, unspecified: Secondary | ICD-10-CM | POA: Diagnosis not present

## 2023-02-14 DIAGNOSIS — M810 Age-related osteoporosis without current pathological fracture: Secondary | ICD-10-CM | POA: Diagnosis not present

## 2023-02-14 DIAGNOSIS — Z0000 Encounter for general adult medical examination without abnormal findings: Secondary | ICD-10-CM | POA: Diagnosis not present

## 2023-07-16 DIAGNOSIS — R1013 Epigastric pain: Secondary | ICD-10-CM | POA: Diagnosis not present

## 2023-07-16 DIAGNOSIS — G609 Hereditary and idiopathic neuropathy, unspecified: Secondary | ICD-10-CM | POA: Diagnosis not present

## 2023-07-16 DIAGNOSIS — M810 Age-related osteoporosis without current pathological fracture: Secondary | ICD-10-CM | POA: Diagnosis not present

## 2023-07-16 DIAGNOSIS — I10 Essential (primary) hypertension: Secondary | ICD-10-CM | POA: Diagnosis not present

## 2023-08-03 ENCOUNTER — Ambulatory Visit: Payer: BC Managed Care – PPO | Admitting: Podiatry

## 2023-08-03 ENCOUNTER — Ambulatory Visit: Payer: BC Managed Care – PPO

## 2023-08-03 ENCOUNTER — Encounter: Payer: Self-pay | Admitting: Podiatry

## 2023-08-03 VITALS — Ht 63.0 in | Wt 130.0 lb

## 2023-08-03 DIAGNOSIS — M79672 Pain in left foot: Secondary | ICD-10-CM | POA: Diagnosis not present

## 2023-08-03 DIAGNOSIS — M79671 Pain in right foot: Secondary | ICD-10-CM

## 2023-08-03 MED ORDER — TRIAMCINOLONE ACETONIDE 10 MG/ML IJ SUSP
10.0000 mg | Freq: Once | INTRAMUSCULAR | Status: AC
  Administered 2023-08-03: 10 mg via INTRA_ARTICULAR

## 2023-08-03 NOTE — Patient Instructions (Signed)

## 2023-08-05 NOTE — Progress Notes (Signed)
Subjective:   Patient ID: Rhonda Deleon, female   DOB: 57 y.o.   MRN: [id number]   HPI Patient states she has been having severe pain in her right foot for around 3 months and she is seen several physicians and was put on gabapentin with no relief.  States it is worse when she gets up and now she is developing some pain in her leg in the outside of her foot.  Patient does not smoke likes to be active   Review of Systems  All other systems reviewed and are negative.       Objective:  Physical Exam Vitals and nursing note reviewed.  Constitutional:      Appearance: She is well-developed.  Pulmonary:     Effort: Pulmonary effort is normal.  Musculoskeletal:        General: Normal range of motion.  Skin:    General: Skin is warm.  Neurological:     Mental Status: She is alert.     Neurovascular status intact muscle strength found to be adequate range of motion adequate.  Patient is found to have exquisite discomfort plantar aspect right heel with a moderate diminishment of the fat pad thin heel and narrow foot.  Patient has discomfort in the peroneal base right and has some Achilles pain which is probably compensatory.  Patient has good digital perfusion well-oriented x 3     Assessment:  Acute fasciitis right inflammation fluid of the medial band     Plan:  H&P reviewed condition and compensation and were focusing on the plantar heel.  Reviewed x-ray today I did sterile prep I injected the plantar fascia 3 mg Kenalog 5 mg Xylocaine applied fascial brace to lift up the arch fitted properly into the arch in order to take pressure off the heel and I advised on stretching exercises shoe gear modification.  Reappoint 2 weeks  X-rays indicate small spur with a moderate diminishment of the arch height

## 2023-08-17 ENCOUNTER — Ambulatory Visit: Payer: BC Managed Care – PPO | Admitting: Podiatry

## 2023-08-17 DIAGNOSIS — Z79899 Other long term (current) drug therapy: Secondary | ICD-10-CM | POA: Diagnosis not present

## 2023-08-17 DIAGNOSIS — R0602 Shortness of breath: Secondary | ICD-10-CM | POA: Diagnosis not present

## 2023-08-17 DIAGNOSIS — R1013 Epigastric pain: Secondary | ICD-10-CM | POA: Diagnosis not present

## 2023-08-17 DIAGNOSIS — M810 Age-related osteoporosis without current pathological fracture: Secondary | ICD-10-CM | POA: Diagnosis not present

## 2023-09-07 ENCOUNTER — Ambulatory Visit: Payer: BC Managed Care – PPO | Admitting: Podiatry

## 2023-10-26 ENCOUNTER — Encounter: Payer: Self-pay | Admitting: Podiatry

## 2023-10-26 ENCOUNTER — Ambulatory Visit: Payer: BC Managed Care – PPO | Admitting: Podiatry

## 2023-10-26 DIAGNOSIS — M722 Plantar fascial fibromatosis: Secondary | ICD-10-CM

## 2023-10-26 MED ORDER — TRIAMCINOLONE ACETONIDE 10 MG/ML IJ SUSP
10.0000 mg | Freq: Once | INTRAMUSCULAR | Status: AC
  Administered 2023-10-26: 10 mg via INTRA_ARTICULAR

## 2023-10-26 NOTE — Progress Notes (Signed)
Subjective:   Patient ID: Rhonda Deleon, female   DOB: 58 y.o.   MRN: [id number]   HPI Patient states that he will seem to be doing better but then she reinjured it again at the end of November and it has been hurting her quite a bit since then and she is not sure if she may have torn part of the tendon from the injury.  Patient states has been very tender and she has trouble anytime she gets up after walking    ROS      Objective:  Physical Exam  Neurovascular status intact patient is found to have severe inflammation plantar heel and slightly into the arch right fluid buildup painful when pressed     Assessment:  Acute plantar fasciitis right within the arch and heel area no indications currently of full tendon tear but it is possible there is a partial tear     Plan:  H&P reviewed sterile prep injected the fascia arch heel 3 mg Dexasone Kenalog 5 mg Xylocaine and dispensed night splint with ice packs to stretch this foot properly with instructions on how to use it and to be very careful on the foot.  Patient will be seen back for [place] to recheck again as needed and may require more aggressive treatment
